# Patient Record
Sex: Male | Born: 1975
Health system: Southern US, Community
[De-identification: ages and names within clinical notes are randomized; demographics above are authoritative.]

## PROBLEM LIST (undated history)

## (undated) DIAGNOSIS — K8020 Calculus of gallbladder without cholecystitis without obstruction: Secondary | ICD-10-CM

## (undated) DIAGNOSIS — Q459 Congenital malformation of digestive system, unspecified: Secondary | ICD-10-CM

## (undated) DIAGNOSIS — M419 Scoliosis, unspecified: Secondary | ICD-10-CM

## (undated) HISTORY — DX: Calculus of gallbladder without cholecystitis without obstruction: K80.20

## (undated) HISTORY — PX: COLOSTOMY: SHX63

## (undated) HISTORY — PX: COLON SURGERY: SHX602

## (undated) HISTORY — DX: Congenital malformation of digestive system, unspecified: Q45.9

## (undated) HISTORY — DX: Scoliosis, unspecified: M41.9

---
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
## 1998-02-04 ENCOUNTER — Emergency Department (HOSPITAL_COMMUNITY): Admission: EM | Admit: 1998-02-04 | Discharge: 1998-02-04 | Payer: Self-pay | Admitting: Emergency Medicine

## 1999-05-21 ENCOUNTER — Emergency Department (HOSPITAL_COMMUNITY): Admission: EM | Admit: 1999-05-21 | Discharge: 1999-05-21 | Payer: Self-pay | Admitting: Emergency Medicine

## 1999-05-21 ENCOUNTER — Encounter: Payer: Self-pay | Admitting: Emergency Medicine

## 2004-07-27 ENCOUNTER — Ambulatory Visit: Payer: Self-pay | Admitting: Family Medicine

## 2004-10-20 ENCOUNTER — Emergency Department (HOSPITAL_COMMUNITY): Admission: EM | Admit: 2004-10-20 | Discharge: 2004-10-20 | Payer: Self-pay | Admitting: Emergency Medicine

## 2005-01-19 ENCOUNTER — Ambulatory Visit: Payer: Self-pay | Admitting: Internal Medicine

## 2005-07-08 ENCOUNTER — Emergency Department (HOSPITAL_COMMUNITY): Admission: EM | Admit: 2005-07-08 | Discharge: 2005-07-08 | Payer: Self-pay | Admitting: Emergency Medicine

## 2006-11-01 ENCOUNTER — Emergency Department (HOSPITAL_COMMUNITY): Admission: EM | Admit: 2006-11-01 | Discharge: 2006-11-01 | Payer: Self-pay | Admitting: Emergency Medicine

## 2007-12-09 ENCOUNTER — Emergency Department (HOSPITAL_COMMUNITY): Admission: EM | Admit: 2007-12-09 | Discharge: 2007-12-10 | Payer: Self-pay | Admitting: Emergency Medicine

## 2008-02-26 ENCOUNTER — Emergency Department (HOSPITAL_COMMUNITY): Admission: EM | Admit: 2008-02-26 | Discharge: 2008-02-26 | Payer: Self-pay | Admitting: Emergency Medicine

## 2008-03-06 ENCOUNTER — Emergency Department (HOSPITAL_COMMUNITY): Admission: EM | Admit: 2008-03-06 | Discharge: 2008-03-06 | Payer: Self-pay | Admitting: Emergency Medicine

## 2008-03-19 ENCOUNTER — Emergency Department (HOSPITAL_COMMUNITY): Admission: EM | Admit: 2008-03-19 | Discharge: 2008-03-19 | Payer: Self-pay | Admitting: Emergency Medicine

## 2008-03-31 ENCOUNTER — Emergency Department (HOSPITAL_COMMUNITY): Admission: EM | Admit: 2008-03-31 | Discharge: 2008-03-31 | Payer: Self-pay | Admitting: Emergency Medicine

## 2008-04-23 ENCOUNTER — Emergency Department (HOSPITAL_COMMUNITY): Admission: EM | Admit: 2008-04-23 | Discharge: 2008-04-23 | Payer: Self-pay | Admitting: Emergency Medicine

## 2009-12-20 ENCOUNTER — Emergency Department (HOSPITAL_COMMUNITY): Admission: EM | Admit: 2009-12-20 | Discharge: 2009-12-20 | Payer: Self-pay | Admitting: Emergency Medicine

## 2010-06-09 DIAGNOSIS — M419 Scoliosis, unspecified: Secondary | ICD-10-CM

## 2010-06-09 HISTORY — DX: Scoliosis, unspecified: M41.9

## 2010-07-16 ENCOUNTER — Emergency Department (HOSPITAL_COMMUNITY): Admission: EM | Admit: 2010-07-16 | Discharge: 2010-07-03 | Payer: Self-pay | Admitting: Emergency Medicine

## 2010-10-20 LAB — DIFFERENTIAL
Basophils Absolute: 0 10*3/uL (ref 0.0–0.1)
Basophils Relative: 0 % (ref 0–1)
Monocytes Relative: 5 % (ref 3–12)
Neutro Abs: 11.9 10*3/uL — ABNORMAL HIGH (ref 1.7–7.7)
Neutrophils Relative %: 88 % — ABNORMAL HIGH (ref 43–77)

## 2010-10-20 LAB — CBC
HCT: 45.8 % (ref 39.0–52.0)
MCH: 26.4 pg (ref 26.0–34.0)
MCV: 81.9 fL (ref 78.0–100.0)
RDW: 13.7 % (ref 11.5–15.5)
WBC: 13.5 10*3/uL — ABNORMAL HIGH (ref 4.0–10.5)

## 2010-10-20 LAB — COMPREHENSIVE METABOLIC PANEL
ALT: 35 U/L (ref 0–53)
AST: 33 U/L (ref 0–37)
Albumin: 4.6 g/dL (ref 3.5–5.2)
Alkaline Phosphatase: 53 U/L (ref 39–117)
BUN: 5 mg/dL — ABNORMAL LOW (ref 6–23)
CO2: 32 mEq/L (ref 19–32)
Calcium: 10 mg/dL (ref 8.4–10.5)
Chloride: 100 mEq/L (ref 96–112)
Creatinine, Ser: 1.22 mg/dL (ref 0.4–1.5)
GFR calc Af Amer: >60 mL/min (ref >60)
GFR calc non Af Amer: >60 mL/min (ref >60)
Glucose, Bld: 95 mg/dL (ref 70–99)
Potassium: 4.2 mEq/L (ref 3.5–5.1)
Sodium: 141 mEq/L (ref 135–145)
Total Bilirubin: 1.4 mg/dL — ABNORMAL HIGH (ref 0.3–1.2)
Total Protein: 7.7 g/dL (ref 6.0–8.3)

## 2010-10-20 LAB — LIPASE, BLOOD: Lipase: 26 U/L (ref 11–59)

## 2010-11-03 ENCOUNTER — Emergency Department (HOSPITAL_COMMUNITY)
Admission: EM | Admit: 2010-11-03 | Discharge: 2010-11-03 | Disposition: A | Discharge status: Home / Self Care | Payer: Self-pay | Attending: Emergency Medicine | Admitting: Emergency Medicine

## 2010-11-03 DIAGNOSIS — R0609 Other forms of dyspnea: Secondary | ICD-10-CM | POA: Insufficient documentation

## 2010-11-03 DIAGNOSIS — R112 Nausea with vomiting, unspecified: Secondary | ICD-10-CM | POA: Insufficient documentation

## 2010-11-03 DIAGNOSIS — T781XXA Other adverse food reactions, not elsewhere classified, initial encounter: Secondary | ICD-10-CM | POA: Insufficient documentation

## 2010-11-03 DIAGNOSIS — X58XXXA Exposure to other specified factors, initial encounter: Secondary | ICD-10-CM | POA: Insufficient documentation

## 2010-11-03 DIAGNOSIS — R0989 Other specified symptoms and signs involving the circulatory and respiratory systems: Secondary | ICD-10-CM | POA: Insufficient documentation

## 2011-05-07 LAB — COMPREHENSIVE METABOLIC PANEL
ALT: 46
AST: 39 — ABNORMAL HIGH
CO2: 28
Chloride: 107
GFR calc Af Amer: >60
GFR calc non Af Amer: >60
Sodium: 141
Total Bilirubin: 1.2

## 2011-05-07 LAB — DIFFERENTIAL
Basophils Absolute: 0
Eosinophils Absolute: 0.3
Eosinophils Relative: 3
Lymphs Abs: 1.2

## 2011-05-07 LAB — LIPASE, BLOOD: Lipase: 19

## 2011-05-07 LAB — CBC
RBC: 5.1
WBC: 10

## 2011-12-16 ENCOUNTER — Ambulatory Visit: Payer: Self-pay | Admitting: Family Medicine

## 2012-01-05 ENCOUNTER — Emergency Department (HOSPITAL_COMMUNITY)
Admission: EM | Admit: 2012-01-05 | Discharge: 2012-01-05 | Disposition: A | Discharge status: Home / Self Care | Payer: Self-pay | Attending: Emergency Medicine | Admitting: Emergency Medicine

## 2012-01-05 ENCOUNTER — Emergency Department (HOSPITAL_COMMUNITY): Payer: Self-pay

## 2012-01-05 ENCOUNTER — Encounter (HOSPITAL_COMMUNITY): Payer: Self-pay | Admitting: *Deleted

## 2012-01-05 DIAGNOSIS — R079 Chest pain, unspecified: Secondary | ICD-10-CM | POA: Insufficient documentation

## 2012-01-05 DIAGNOSIS — R0789 Other chest pain: Secondary | ICD-10-CM

## 2012-01-05 DIAGNOSIS — R071 Chest pain on breathing: Secondary | ICD-10-CM | POA: Insufficient documentation

## 2012-01-05 DIAGNOSIS — W1809XA Striking against other object with subsequent fall, initial encounter: Secondary | ICD-10-CM | POA: Insufficient documentation

## 2012-01-05 DIAGNOSIS — S2341XA Sprain of ribs, initial encounter: Secondary | ICD-10-CM | POA: Insufficient documentation

## 2012-01-05 NOTE — ED Notes (Signed)
Discharge instructions reviewed with pt; verbalizes understanding.  No questions asked; no further c/o's voiced.  Pt ambulatory to lobby.  NAD noted. 

## 2012-01-05 NOTE — ED Notes (Signed)
To ED for eval of right side pinpoint cp. Started a week ago. States any movement and lying on right side makes pain worse. Skin w/d, resp e/u. Denies cough. Denies fevers. Walking and general activity does not make pain worse, per pt

## 2012-01-05 NOTE — ED Provider Notes (Signed)
History   This chart was scribed for Rickey Melter, MD by Rickey Fisher. The patient was seen in room STRE7/STRE7. Patient's care was started at 1146.   CSN: [id number]  Arrival date & time 01/05/12  1146   None     Chief Complaint  Patient presents with  . Chest Pain    (Consider location/radiation/quality/duration/timing/severity/associated sxs/prior treatment) HPI Rickey Fisher is a 36 y.o. male who presents to the Emergency Department complaining of right sided chest pain onset one week ago. Laying on right side, Sneezing and moving right shoulder exacerbates his chest. Denies fever, cough. Pt notes a fall one week ago hitting right side of body when he missed a step.     History reviewed. No pertinent past medical history.  Past Surgical History  Procedure Date  . Colostomy     No family history on file.  History  Substance Use Topics  . Smoking status: Current Everyday Smoker -- 0.5 packs/day    Types: Cigarettes  . Smokeless tobacco: Not on file  . Alcohol Use: Yes     occassional      Review of Systems  All other systems reviewed and are negative.    Allergies  Shellfish allergy  Home Medications  No current outpatient prescriptions on file.  BP 133/85  Pulse 70  Temp(Src) 98.2 F (36.8 C) (Oral)  Resp 16  SpO2 99%  Physical Exam  Nursing note and vitals reviewed. Constitutional: He is oriented to person, place, and time. He appears well-developed and well-nourished. No distress.  HENT:  Head: Normocephalic and atraumatic.  Eyes: EOM are normal.  Neck: Neck supple. No tracheal deviation present.  Cardiovascular: Normal rate.   Pulmonary/Chest: Effort normal. No respiratory distress.  Abdominal: He exhibits no distension.  Musculoskeletal: Normal range of motion. He exhibits tenderness. He exhibits no edema.       Tenderness and swelling to the junction of the right second rib and sternum  Neurological: He is alert and oriented to  person, place, and time. No sensory deficit.  Skin: Skin is warm and dry.  Psychiatric: He has a normal mood and affect. His behavior is normal.    ED Course  Procedures (including critical care time)  1344 Pt seen and agrees with course of care.    Date: 01/05/2012  Rate: 83  Rhythm: normal sinus rhythm  QRS Axis: right  Intervals: normal  ST/T Wave abnormalities: normal  Conduction Disutrbances:none  Narrative Interpretation: sinus arrhythymia  Old EKG Reviewed: unchanged    Labs Reviewed - No data to display Dg Chest 2 View  01/05/2012  *RADIOLOGY REPORT*  Clinical Data: Chest pain.  CHEST - 2 VIEW  Comparison: Chest x-ray 12/20/2009.  Findings: Lung volumes are normal.  No consolidative airspace disease.  No pleural effusions.  No pneumothorax.  No pulmonary nodule or mass noted.  Pulmonary vasculature and the cardiomediastinal silhouette are within normal limits.  IMPRESSION: 1. No radiographic evidence of acute cardiopulmonary disease.  Original Report Authenticated By: Florencia Reasons, M.D.     1. Chest wall pain   2. Sprain of costal cartilage without injury to sternum       MDM  Isolated musculoskeletal injury, right upper chest wall. Doubt dislocated rib, pneumothorax, ACS, pneumonia. Stable for discharge   Plan: Home Medications- IBU; Home Treatments- heat; Recommended follow up- PCP prn     I personally performed the services described in this documentation, which was scribed in my presence. The recorded information has  been reviewed and considered.     Rickey Melter, MD 01/05/12 1352

## 2012-01-05 NOTE — Discharge Instructions (Signed)
You have strained the junction between the right second rib, and your sternum. It will improve with time. You can use heat on the area to help the discomfort. For pain, take ibuprofen, 400 mg 3 times a day. See the Dr. of your choice for problems.   Chest Wall Pain Chest wall pain is pain in or around the bones and muscles of your chest. It may take up to 6 weeks to get better. It may take longer if you must stay physically active in your work and activities.  CAUSES  Chest wall pain may happen on its own. However, it may be caused by:  A viral illness like the flu.   Injury.   Coughing.   Exercise.   Arthritis.   Fibromyalgia.   Shingles.  HOME CARE INSTRUCTIONS   Avoid overtiring physical activity. Try not to strain or perform activities that cause pain. This includes any activities using your chest or your abdominal and side muscles, especially if heavy weights are used.   Put ice on the sore area.   Put ice in a plastic bag.   Place a towel between your skin and the bag.   Leave the ice on for 15 to 20 minutes per hour while awake for the first 2 days.   Only take over-the-counter or prescription medicines for pain, discomfort, or fever as directed by your caregiver.  SEEK IMMEDIATE MEDICAL CARE IF:   Your pain increases, or you are very uncomfortable.   You have a fever.   Your chest pain becomes worse.   You have new, unexplained symptoms.   You have nausea or vomiting.   You feel sweaty or lightheaded.   You have a cough with phlegm (sputum), or you cough up blood.  MAKE SURE YOU:   Understand these instructions.   Will watch your condition.   Will get help right away if you are not doing well or get worse.  Document Released: 07/26/2005 Document Revised: 07/15/2011 Document Reviewed: 03/22/2011 [facility] Patient Information 2012 [place], [place].Costochondritis Costochondritis (Tietze syndrome), or costochondral separation, is a swelling and  irritation (inflammation) of the tissue (cartilage) that connects your ribs with your breastbone (sternum). It may occur on its own (spontaneously), through damage caused by an accident (trauma), or simply from coughing or minor exercise. It may take up to 6 weeks to get better and longer if you are unable to be conservative in your activities. HOME CARE INSTRUCTIONS   Avoid exhausting physical activity. Try not to strain your ribs during normal activity. This would include any activities using chest, belly (abdominal), and side muscles, especially if heavy weights are used.   Use ice for 15 to 20 minutes per hour while awake for the first 2 days. Place the ice in a plastic bag, and place a towel between the bag of ice and your skin.   Only take over-the-counter or prescription medicines for pain, discomfort, or fever as directed by your caregiver.  SEEK IMMEDIATE MEDICAL CARE IF:   Your pain increases or you are very uncomfortable.   You have a fever.   You develop difficulty with your breathing.   You cough up blood.   You develop worse chest pains, shortness of breath, sweating, or vomiting.   You develop new, unexplained problems (symptoms).  MAKE SURE YOU:   Understand these instructions.   Will watch your condition.   Will get help right away if you are not doing well or get worse.  Document Released: 05/05/2005  Document Revised: 07/15/2011 Document Reviewed: 03/13/2008 [facility] Patient Information 2012 [place], [place]. RESOURCE GUIDE  Dental Problems  Patients with Medicaid: [facility] [phone] [address]                                           [phone] [address] Phone:  [phone]                                                  Phone:  [phone]  If unable to pay or uninsured, contact:  [facility] or [facility]. to become qualified for the adult dental clinic.  Chronic Pain Problems Contact  [facility]  [phone] Patients need to be referred by their primary care doctor.  Insufficient Money for Medicine Contact [employer]:  call "211" or [facility] [phone].  No Primary Care Doctor Call [facility]  [phone] Other agencies that provide inexpensive medical care    [facility]  [phone]    [facility] Internal [facility]  [phone]    [facility]  [phone]    [facility]  [phone]    [facility]  [phone]    [facility]  [phone]  Psychological Services [facility]  [phone] [facility]  [phone] [facility] Mental [facility]   [phone] (emergency services [phone])  Substance Abuse Resources [facility]  [phone] [facility] 2523013374 [facility] [place] [phone] Floydene Flock [phone] Residential & Outpatient Substance Abuse Program  [phone]  Abuse/Neglect [facility] Child Abuse Hotline (223)155-2621 [facility] Child Abuse Hotline 647-144-8651 (After Hours)  Emergency Shelter [facility] [phone]  M[facility] [phone] [place] Alert Services [phone]  MRSA Hotline #:   (517)020-1676    [facility] Resources  [facility]     [employer]                          [facility] Dept. [address] [place]                       [address]      [address]                                                  Cristobal Goldmann Phone:  [phone]                                   Phone:  [phone]  Phone:  [phone]  [facility] Mental Health Phone:  [phone]  [facility] Child Abuse Hotline 863-585-9024 [phone] (After Hours)

## 2012-02-03 ENCOUNTER — Ambulatory Visit: Payer: Self-pay | Admitting: Family Medicine

## 2012-02-13 ENCOUNTER — Emergency Department (HOSPITAL_COMMUNITY): Payer: Self-pay

## 2012-02-13 ENCOUNTER — Emergency Department (HOSPITAL_COMMUNITY)
Admission: EM | Admit: 2012-02-13 | Discharge: 2012-02-14 | Disposition: A | Discharge status: Home / Self Care | Payer: Self-pay | Attending: Emergency Medicine | Admitting: Emergency Medicine

## 2012-02-13 ENCOUNTER — Encounter (HOSPITAL_COMMUNITY): Payer: Self-pay | Admitting: Nurse Practitioner

## 2012-02-13 DIAGNOSIS — M542 Cervicalgia: Secondary | ICD-10-CM | POA: Insufficient documentation

## 2012-02-13 DIAGNOSIS — S01501A Unspecified open wound of lip, initial encounter: Secondary | ICD-10-CM | POA: Insufficient documentation

## 2012-02-13 DIAGNOSIS — W010XXA Fall on same level from slipping, tripping and stumbling without subsequent striking against object, initial encounter: Secondary | ICD-10-CM | POA: Insufficient documentation

## 2012-02-13 DIAGNOSIS — Z79899 Other long term (current) drug therapy: Secondary | ICD-10-CM | POA: Insufficient documentation

## 2012-02-13 DIAGNOSIS — W19XXXA Unspecified fall, initial encounter: Secondary | ICD-10-CM

## 2012-02-13 DIAGNOSIS — S6290XA Unspecified fracture of unspecified wrist and hand, initial encounter for closed fracture: Secondary | ICD-10-CM

## 2012-02-13 DIAGNOSIS — S01511A Laceration without foreign body of lip, initial encounter: Secondary | ICD-10-CM

## 2012-02-13 DIAGNOSIS — K089 Disorder of teeth and supporting structures, unspecified: Secondary | ICD-10-CM | POA: Insufficient documentation

## 2012-02-13 DIAGNOSIS — S161XXA Strain of muscle, fascia and tendon at neck level, initial encounter: Secondary | ICD-10-CM

## 2012-02-13 DIAGNOSIS — M79609 Pain in unspecified limb: Secondary | ICD-10-CM | POA: Insufficient documentation

## 2012-02-13 MED ORDER — TETANUS-DIPHTH-ACELL PERTUSSIS 5-2.5-18.5 LF-MCG/0.5 IM SUSP: 0.5000 mL | Freq: Once | INTRAMUSCULAR | Status: DC

## 2012-02-13 MED ORDER — OXYCODONE-ACETAMINOPHEN 5-325 MG PO TABS
2.0000 | ORAL_TABLET | Freq: Once | ORAL | Status: AC
  Administered 2012-02-13: 2 via ORAL
  Filled 2012-02-13: qty 2

## 2012-02-13 MED ORDER — TETANUS-DIPHTHERIA TOXOIDS TD 5-2 LFU IM INJ
0.5000 mL | INJECTION | Freq: Once | INTRAMUSCULAR | Status: AC
  Administered 2012-02-13: 0.5 mL via INTRAMUSCULAR
  Filled 2012-02-13: qty 0.5

## 2012-02-13 NOTE — ED Notes (Addendum)
Pt states he tripped walking down the road landing on concrete. C/o neck pain and laceration to vermillion border of upper and lower L lip, states teeth are intact. A&Ox4, resp e/u. Unknown last tetanus shot. Also c/o r hand pain from trying to catch himself.

## 2012-02-13 NOTE — Progress Notes (Signed)
Orthopedic Tech Progress Note Patient Details:  Rickey Fisher Apr 29, 1976 [id number]  Ortho Devices Type of Ortho Device: Ulna gutter splint Ortho Device/Splint Location: right hand Ortho Device/Splint Interventions: Application   Rickey Fisher 02/13/2012, 8:55 PM

## 2012-02-13 NOTE — ED Provider Notes (Signed)
History     CSN: [id number]  Arrival date & time 02/13/12  6962   First MD Initiated Contact with Patient 02/13/12 2047      Chief Complaint  Patient presents with  . Lip Laceration    (Consider location/radiation/quality/duration/timing/severity/associated sxs/prior treatment) HPI Comments: Patient tripped over a loose shoe lace falling to the ground caught himself with R hand which is now painful and hit his face on the ground Now with laceration to both upper and lower lip  Teeth intact No LOC   The history is provided by the patient.    History reviewed. No pertinent past medical history.  Past Surgical History  Procedure Date  . Colostomy   . Colon surgery     History reviewed. No pertinent family history.  History  Substance Use Topics  . Smoking status: Current Everyday Smoker -- 0.1 packs/day    Types: Cigarettes  . Smokeless tobacco: Not on file  . Alcohol Use: No      Review of Systems  Constitutional: Negative for fever and chills.  HENT: Positive for neck pain and dental problem. Negative for hearing loss, ear pain, neck stiffness and ear discharge.   Gastrointestinal: Negative for nausea.  Skin: Positive for wound.  Neurological: Negative for dizziness, weakness and headaches.    Allergies  Shellfish allergy  Home Medications   Current Outpatient Rx  Name Route Sig Dispense Refill  . METAXALONE 800 MG PO TABS Oral Take 0.5 tablets (400 mg total) by mouth 3 (three) times daily. 21 tablet 0  . OXYCODONE-ACETAMINOPHEN 5-325 MG PO TABS Oral Take 2 tablets by mouth once. 20 tablet 0    BP 133/84  Pulse 101  Temp 98.5 F (36.9 C) (Oral)  Resp 19  SpO2 100%  Physical Exam  Constitutional: He is oriented to person, place, and time. He appears well-developed and well-nourished.  HENT:  Head: Normocephalic. Head is with laceration.         Teeth intact very poor dental hygeine   Neck: Normal range of motion. Muscular tenderness present.     Cardiovascular: Normal rate.   Pulmonary/Chest: Effort normal.  Abdominal: Soft.  Musculoskeletal: He exhibits tenderness. He exhibits no edema.       Arms: Neurological: He is alert and oriented to person, place, and time.  Skin: Skin is warm and dry. Abrasion, bruising and laceration noted.    ED Course  LACERATION REPAIR Date/Time: 02/13/2012 9:31 PM Performed by: Arman Filter Authorized by: Arman Filter Consent: Verbal consent obtained. Risks and benefits: risks, benefits and alternatives were discussed Patient understanding: patient states understanding of the procedure being performed Patient identity confirmed: verbally with patient Time out: Immediately prior to procedure a "time out" was called to verify the correct patient, procedure, equipment, support staff and site/side marked as required. Body area: head/neck Location details: upper lip Full thickness lip laceration: yes Vermillion border involved: yes Laceration length: 0.5 cm Foreign bodies: unknown Tendon involvement: none Nerve involvement: none Vascular damage: no Anesthesia: local infiltration Local anesthetic: lidocaine 1% without epinephrine Anesthetic total: 0.5 ml Patient sedated: no Preparation: Patient was prepped and draped in the usual sterile fashion. Irrigation solution: saline Amount of cleaning: standard Debridement: none Degree of undermining: none Subcutaneous closure: 5-0 Vicryl Number of sutures: 3 Technique: simple Approximation: close Approximation difficulty: simple Lip approximation: vermillion border well aligned Patient tolerance: Patient tolerated the procedure well with no immediate complications.   (including critical care time)  Labs Reviewed - No data  to display Dg Cervical Spine Complete  02/13/2012  *RADIOLOGY REPORT*  Clinical Data: Fall 3 hours ago  CERVICAL SPINE - COMPLETE 4+ VIEW  Comparison: None.  Findings: Reversal of the normally lordotic cervical spine.  Unremarkable prevertebral soft tissues.  Retrolisthesis C6 upon C7 is approximately 4 mm.  No vertebral compression deformity.  There is moderate narrowing at C3-4, C4-5, and C6-7.  Anterior osteophyte formation occurs from C4-C7.  No definite fractures are visualized. Foramen are grossly patent. Odontoid view is limited.  IMPRESSION: There is malalignment in the cervical spine as described with reversed lordosis and retrolisthesis C6 upon C7.  Occult injury cannot be excluded.  CT can be performed to further delineate.  Original Report Authenticated By: Donavan Burnet, M.D.   Ct Cervical Spine Wo Contrast  02/13/2012  *RADIOLOGY REPORT*  Clinical Data: Status post fall; neck pain.  Question of occult injury on cervical spine radiographs.  CT CERVICAL SPINE WITHOUT CONTRAST  Technique:  Multidetector CT imaging of the cervical spine was performed. Multiplanar CT image reconstructions were also generated.  Comparison: Cervical spine radiographs performed earlier today at 10:03 p.m.  Findings: There is no evidence of fracture or subluxation. Reversal of the normal lordotic curvature of the cervical spine appears chronic in nature, with associated anterior disc osteophyte complexes.  There is slight narrowing of the intervertebral disc space at C6-C7.  Slight flattening of the vertebral bodies is noted at C5 and C6.  Prevertebral soft tissues are within normal limits.  Prominent scattered calcification is seen within the left thyroid lobe, with an associated poorly characterized 1.4 cm mass; additional hypodensity is noted within the right thyroid lobe.  A prominent bleb is noted at the right lung apex.  No significant soft tissue abnormalities are seen.  The visualized portions of the brain are grossly unremarkable in appearance.  IMPRESSION:  1.  No evidence of fracture or subluxation along the cervical spine. 2.  Mild chronic degenerative changes noted at the lower cervical spine. 3.  Prominent calcification at  the left thyroid lobe, with poorly characterized hypodense lesions in both thyroid lobes, measuring approximately 1.4 cm on each side.  Consider further evaluation with thyroid ultrasound.  If patient is clinically hyperthyroid, consider nuclear medicine thyroid uptake and scan. 4.  Prominent bleb at the right lung apex.  Original Report Authenticated By: Tonia Ghent, M.D.   Dg Hand Complete Right  02/13/2012  *RADIOLOGY REPORT*  Clinical Data: Fall.  Third and fourth finger pain.  RIGHT HAND - COMPLETE 3+ VIEW  Comparison: None.  Findings: Anatomic alignment bones of the right hand.  There is a nondisplaced transverse fracture of the base of the proximal phalanx of the right ring finger. No other fractures are identified.  Long finger appears intact.  No intra-articular extension of the fracture.  IMPRESSION: Nondisplaced transverse fracture of the proximal phalanx base of the right ring finger.  Original Report Authenticated By: Andreas Newport, M.D.     1. Fall   2. Lip laceration   3. Cervical strain   4. Hand fracture       MDM  Patient still having significant neck pain placed in soft collar for comfort        Arman Filter, NP 02/14/12 0018  Arman Filter, NP 02/14/12 1610

## 2012-02-14 MED ORDER — OXYCODONE-ACETAMINOPHEN 5-325 MG PO TABS: 2.0000 | ORAL_TABLET | Freq: Once | ORAL | Status: AC

## 2012-02-14 MED ORDER — METAXALONE 400 MG HALF TABLET
400.0000 mg | ORAL_TABLET | Freq: Once | ORAL | Status: AC
  Administered 2012-02-14: 400 mg via ORAL
  Filled 2012-02-14: qty 1

## 2012-02-14 MED ORDER — METAXALONE 800 MG PO TABS: 400.0000 mg | ORAL_TABLET | Freq: Three times a day (TID) | ORAL | Status: AC

## 2012-02-14 NOTE — ED Provider Notes (Signed)
Medical screening examination/treatment/procedure(s) were performed by non-physician practitioner and as supervising physician I was immediately available for consultation/collaboration.   Keimari B. Bernette Mayers, MD 02/14/12 [phone]

## 2012-04-05 ENCOUNTER — Telehealth: Payer: Self-pay | Admitting: *Deleted

## 2012-04-05 NOTE — Telephone Encounter (Signed)
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 He states he makes his colostomy bags out of [employer] grocery bags and knows how to keep this area clean, he states he is on no medicines but feels very worried not having a regular physician, states he goes to ED occasionally to get "checked out". Had a fall in July stating he "passed out". Spoke w/ doriss. And she in turn spoke w/ dr Josem Kaufmann, pt is given appt 8/29 at 1315 w/ sadek per doriss.

## 2012-04-06 ENCOUNTER — Encounter: Payer: Self-pay | Admitting: Internal Medicine

## 2012-04-07 ENCOUNTER — Encounter: Payer: Self-pay | Admitting: Licensed Clinical Social Worker

## 2012-04-07 ENCOUNTER — Encounter: Payer: Self-pay | Admitting: Internal Medicine

## 2012-04-07 ENCOUNTER — Ambulatory Visit (INDEPENDENT_AMBULATORY_CARE_PROVIDER_SITE_OTHER): Payer: Self-pay | Admitting: Internal Medicine

## 2012-04-07 VITALS — BP 108/64 | HR 60 | Temp 96.5°F | Ht 74.0 in | Wt 150.1 lb

## 2012-04-07 DIAGNOSIS — Z933 Colostomy status: Secondary | ICD-10-CM

## 2012-04-07 DIAGNOSIS — Z932 Ileostomy status: Secondary | ICD-10-CM | POA: Insufficient documentation

## 2012-04-07 NOTE — Progress Notes (Signed)
Subjective:   Patient ID: Rickey Fisher male   DOB: [birth year] 36 y.o.   MRN: [id number]  HPI: [name] is a 36 y.o. man with history significant for cholelithiasis and colostomy who presents today to establish care. 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 He has no complaints today, he would just like help in acquiring colostomy supplies.  Bag since birth; "something wrong with intestines" No MD since childhood  No bloody/purulent discharge.   No fevers  Past Medical History  Diagnosis Date  . Cholelithiasis     CT abd 06/2010   Family History  Problem Relation Age of Onset  . Lupus Father   . Hypertension Mother    History   Social History  . Marital Status: Single    Spouse Name: N/A    Number of Children: 0  . Years of Education: GED   Occupational History  .     Social History Main Topics  . Smoking status: Current Everyday Smoker -- 0.1 packs/day    Types: Cigarettes  . Smokeless tobacco: None  . Alcohol Use: No  . Drug Use: Yes    Special: Marijuana  . Sexually Active: No   Other Topics Concern  . None   Social History Narrative   Worked at [employer]; Grew up in [place]; Went to [employer], dropped out in 11th grade,but acquired GEDLives with friends/family off on on "up in the [place] 3 brothers, 1 sister - [place] in [place].   Review of Systems: Constitutional: Denies fever, chills, diaphoresis, and fatigue. Decreased appetite; mediocre energy HEENT: Denies photophobia, eye pain, redness, hearing loss, ear pain, congestion, sore throat, rhinorrhea, sneezing, mouth sores, trouble swallowing, neck pain, neck stiffness and tinnitus.   Respiratory: Denies SOB, DOE, cough, chest tightness,  and wheezing.   Cardiovascular: Denies chest pain, palpitations and leg swelling.  Gastrointestinal: Denies nausea, vomiting, abdominal pain  (occassional cramps, pruritis- not currently), blood in stool and abdominal distention.  Genitourinary: Denies dysuria, urgency, frequency, hematuria, flank pain and difficulty urinating.  Musculoskeletal: Denies myalgias, back pain, joint swelling, arthralgias and gait problem.  Skin: Denies pallor, rash and wound.  Neurological: Denies dizziness, seizures, syncope, weakness, light-headedness, numbness and headaches.  Psychiatric/Behavioral: Denies suicidal ideation, mood changes, confusion, nervousness, sleep disturbance and agitation  Objective:  Physical Exam: Filed Vitals:   04/07/12 1005  BP: 108/64  Pulse: 60  Temp: 96.5 F (35.8 C)  TempSrc: Oral  Height: 6\' 2"  (1.88 m)  Weight: 150 lb 1.6 oz (68.085 kg)   Constitutional: Vital signs reviewed.  Patient is a well-developed and well-nourished man in no acute distress and cooperative with exam.  Mouth: no erythema or exudates, MMM, poor dentition Eyes: PERRL, EOMI, conjunctivae normal, No scleral icterus.  Cardiovascular: RRR, S1 normal, S2 normal, no MRG, pulses symmetric and intact bilaterally Pulmonary/Chest: CTAB, no wheezes, rales, or rhonchi Abdominal: Soft. Non-tender, non-distended, bowel sounds are normal, no masses, organomegaly, or guarding present. Right lower quadrant colostomy nontender nor erythematous- no colostomy bag in place, but uses a [employer] bag; skin surrounding colostomy is pink and broken down GU: no CVA tenderness Neurological: A&O x3, Strength is normal and symmetric bilaterally, cranial nerve II-XII are grossly intact, no focal motor deficit, sensory intact to light touch bilaterally.  Skin: Warm, dry and intact. No rash, cyanosis, or clubbing.  Psychiatric: Normal mood and affect.  speech and behavior is normal. Judgment and thought content normal. Cognition and memory are normal.   Assessment & Plan:  Case and care discussed with Dr. Kem Kays. Please see problem-oriented charting for further details.  Patient to return as needed, or in approximately 6 months to 1 year for routine followup.

## 2012-04-07 NOTE — Assessment & Plan Note (Signed)
Patient referred to Jaynee Eagles (financial assistance) and Perry Mount (social work)  -Once he is approved for the orange card, he would benefit from evaluation by surgery -We are going to help patient acquire colostomy supplies

## 2012-04-07 NOTE — Progress Notes (Signed)
Mr. Dahlen is new to [facility] and has been using the ED for his ostomy care.  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  States all care and surgeries were done at [facility], but unsure of physician names.  Mr. Mounger has applied for SSDI but was denied, unsure as to why.  Mr. Bouknight receives food stamps and currently lives between family/friends homes.  Pt receives food stamps.  Currently, Mr. Branan states he has been using plastic grocery bags for his ostomy care due to being unable to afford supplies.  CSW met with Mr. Tesler after his scheduled appt.  Discussed Mr. Langille main concern, pt indicates medical issues and obtaining ostomy supplies.  CSW along with pt called the [employer] patient assistance program.  Pt to receive samples and application in approx one week.  Mr. Boomhower to return to [facility] to have CSW assist with completing application.  Mr. Beaumier received 2 colostomy bags today.   Mr. Kienle has his GED and worked in the [employer], but was let go due to working conditions around 2006.  Pt has not worked since.  Mr. Brisendine would like to continue his application for disability/medicaid.  CSW encouraged Mr. Hermann to inquire into Vocational [employer], [employer] and [employer]. Pt denies add'l needs at this time. Mr. Sayre has CSW contact information and hours available.

## 2012-04-07 NOTE — Patient Instructions (Signed)
-  Please be sure to meet with Rudell Cobb and Lynnae January.  We are going to try to get you colostomy supplies.  -If you don't feel well, be sure to call the clinic - we're here for you!  Please be sure to bring all of your medications with you to every visit.  Should you have any new or worsening symptoms, please be sure to call the clinic at [phone].

## 2012-04-21 ENCOUNTER — Ambulatory Visit (INDEPENDENT_AMBULATORY_CARE_PROVIDER_SITE_OTHER): Payer: Self-pay | Admitting: *Deleted

## 2012-04-21 ENCOUNTER — Telehealth: Payer: Self-pay | Admitting: *Deleted

## 2012-04-21 DIAGNOSIS — Z433 Encounter for attention to colostomy: Secondary | ICD-10-CM

## 2012-04-21 NOTE — Telephone Encounter (Signed)
Spoke w/ pt in clinic just here today for ostomy assist, does state he needs supplies, i tried to locate some but didn't find any. Ostomy nurse gave pt 3 bags and pt has appt next week for back pain so will update then. After visit i spoke w/ shanag. sw and she states the company was to mail pt supplies. i called pt and he states yes he got 3 but is afraid they won't send more, he states he completed paperwork and sent back, i will check w/ sw again and call to see if company has rec'd paperwork back.

## 2012-04-21 NOTE — Telephone Encounter (Signed)
Most recent encounter CSW had with pt, Pt spoke with [employer] Patient Assistance Rep regarding delivery of samples to the address pt provided to Rep.  Pt was to receive samples along with an application form.  Please see CSW encounter for add'l details.

## 2012-04-21 NOTE — Progress Notes (Signed)
Ostomy nurse consult:  Pt familiar to WOC team from previous visits to the ER for supplies.  He has had an ileostomy since infancy and has had to manage pouching on his own.  He is independent with pouch application and routines, but obtaining supplies has been difficult since he does not have insurance or Medicare.  Consult requested for skin denuded to peristomal area last week during office visit.  Pt states this occurs when he runs out of pouches and has to apply grocery bags and duct tape. Denies further problems at this time since he obtained the proper pouching system last week.   Clinic has referred pt to [facility] discharge program which has an indigent plan to assist with obtaining supplies.  Gave pt 3 flat one piece pouches for use until other supplies available.  Demonstrated use of stoma powder when skin breakdown occurs and gave him one bottle.  Discussed dietary concerns and avoiding dehydration.  Pt asks appropriate questions.  Stoma red and viable above skin level.  Pouch intact with good seal, not removed at this time. Will not plan to follow further unless re-consulted.  [address] RN, MSN, [employer]  [phone]

## 2012-04-24 ENCOUNTER — Ambulatory Visit: Payer: Self-pay | Admitting: Internal Medicine

## 2012-08-16 ENCOUNTER — Encounter: Payer: Self-pay | Admitting: *Deleted

## 2012-08-16 NOTE — Progress Notes (Signed)
Pt presents c/o being out of colostomy bags/ supplies, he was asked about supplies from [employer] and states he never rec'd the first shipment, he was ask why he had not followed up with [employer] by phone, did he complete his paperwork, he states he completed paperwork and mailed it back but he lost the contact information, he states he lost the clinic phone number and that is why he presents in person today. He is informed that to maintain proper amt of supplies and proper medical care he must keep regular appts, call for problems and "stay on top of programs" that he is enrolled in. He states he will be more responsible and will put the ph#s in his phone. i will give shanag. sw his ph# and ask her to call him i have called melodya. cwocn at [phone], she is going to call [employer] and speak w/ gwen and possibly get pt some bags today but may not be possible

## 2012-08-22 ENCOUNTER — Ambulatory Visit (INDEPENDENT_AMBULATORY_CARE_PROVIDER_SITE_OTHER): Payer: Self-pay | Admitting: Internal Medicine

## 2012-08-22 ENCOUNTER — Ambulatory Visit (HOSPITAL_COMMUNITY)
Admission: RE | Admit: 2012-08-22 | Discharge: 2012-08-22 | Disposition: A | Discharge status: Home / Self Care | Payer: Self-pay | Source: Ambulatory Visit | Attending: Internal Medicine | Admitting: Internal Medicine

## 2012-08-22 ENCOUNTER — Encounter: Payer: Self-pay | Admitting: Internal Medicine

## 2012-08-22 VITALS — BP 119/83 | HR 105 | Temp 96.8°F | Ht 74.5 in | Wt 147.8 lb

## 2012-08-22 DIAGNOSIS — R059 Cough, unspecified: Secondary | ICD-10-CM | POA: Insufficient documentation

## 2012-08-22 DIAGNOSIS — Z933 Colostomy status: Secondary | ICD-10-CM

## 2012-08-22 DIAGNOSIS — R05 Cough: Secondary | ICD-10-CM

## 2012-08-22 DIAGNOSIS — R0602 Shortness of breath: Secondary | ICD-10-CM | POA: Insufficient documentation

## 2012-08-22 DIAGNOSIS — M4120 Other idiopathic scoliosis, site unspecified: Secondary | ICD-10-CM | POA: Insufficient documentation

## 2012-08-22 NOTE — Progress Notes (Signed)
Subjective:   Patient ID: Rickey Fisher male   DOB: 10-23-1975 37 y.o.   MRN: [id number]  HPI: [name] is a 37 y.o. young man with history of cholelithiasis and colostomy who presents today for followup. He was supposed to have gotten his orange card, and we were going to subsequently refer him to surgery because he has had a colostomy since birth after having a colon surgery that he does not have details on. He has had some difficulties with keeping up with colostomy supplies, and has been plugged in to social work for this problem. He still has not gotten his orange card. He has not had a doctor since childhood. He denies purulent or bloody discharge from his colostomy site.  He does complain of a cold for the last week. He notes a runny nose, and a green/white sputum producing cough. He reports symptoms are similar to when he had pneumonia twice in the past. He denies fever but reports chills. Denies sick contacts. He reports shortness of breath, and has decreased his cigarette smoking to approximately one per day. He reports a decreased appetite. He had a bitemporal headache yesterday that has resolved. No muscle aches. No flu shot this year. No nausea or vomiting. He cannot tell if symptoms are staying the same or improving, but definitely not worsening.    Past Medical History  Diagnosis Date  . Cholelithiasis     CT abd 06/2010   No current outpatient prescriptions on file.   Family History  Problem Relation Age of Onset  . Lupus Father   . Hypertension Mother    History   Social History  . Marital Status: Single    Spouse Name: N/A    Number of Children: 0  . Years of Education: GED   Occupational History  .     Social History Main Topics  . Smoking status: Current Every Day Smoker -- 0.1 packs/day    Types: Cigarettes  . Smokeless tobacco: None  . Alcohol Use: No  . Drug Use: Yes    Special: Marijuana  . Sexually Active: No   Other Topics Concern  . None     Social History Narrative   Worked at [employer]; Grew up in [place]; Went to [employer], dropped out in 11th grade,but acquired GEDLives with friends/family off on on "up in the [place] 3 brothers, 1 sister - all in [place] Dad in [place] as well.   Review of Systems: Constitutional: Denies fever, diaphoresis HEENT: Denies photophobia, eye pain, redness, hearing loss, ear pain, sore throat, sneezing, mouth sores, trouble swallowing, neck pain, neck stiffness and tinnitus.   Respiratory: Denies SOB, DOE, chest tightness,  and wheezing.   Cardiovascular: Denies chest pain, palpitations and leg swelling.  Gastrointestinal: Denies nausea, vomiting, abdominal pain, diarrhea, constipation, blood in stool and abdominal distention.  Genitourinary: Denies dysuria, urgency, frequency, hematuria, flank pain and difficulty urinating.  Musculoskeletal: Denies myalgias, back pain, joint swelling, arthralgias and gait problem.  Skin: Denies pallor, rash and wound.  Neurological: Denies dizziness, seizures, syncope, weakness, light-headedness, numbness and headaches.  Psychiatric/Behavioral: Denies suicidal ideation, mood changes, confusion, nervousness, sleep disturbance and agitation  Objective:  Physical Exam: Filed Vitals:   08/22/12 1423  BP: 119/83  Pulse: 105  Temp: 96.8 F (36 C)  TempSrc: Oral  Height: 6' 2.5" (1.892 m)  Weight: 147 lb 12.8 oz (67.042 kg)  SpO2: 98%   Constitutional: Vital signs reviewed.  Patient is a well-developed and well-nourished man in  no acute distress and cooperative with exam.  Head: Normocephalic and atraumatic, no sinus tenderness Mouth: no erythema or exudates, MMM Eyes: PERRL, EOMI, conjunctivae normal, No scleral icterus.  Neck: Supple, Trachea midline normal ROM, No JVD, mass, thyromegaly, or carotid bruit present.  Cardiovascular: Tachycardic, S1 normal, S2 normal, no MRG, pulses symmetric and intact bilaterally Pulmonary/Chest: CTAB, no  wheezes, rales, or rhonchi Abdominal: Soft. Non-tender, non-distended, bowel sounds are normal; Right lower quadrant colostomy nontender nor erythematous- colostomy bag in place; skin surrounding colostomy is pink, but appears better than in August  Musculoskeletal: No joint deformities, erythema, or stiffness, ROM full and no nontender Hematology: no cervical adenopathy.  Neurological: A&O x3, Strength is normal and symmetric bilaterally, cranial nerve II-XII are grossly intact, no focal motor deficit, sensory intact to light touch bilaterally.  Skin: Warm, dry and intact. No rash, cyanosis, or clubbing.  Psychiatric: Normal mood and affect. speech and behavior is normal. Judgment and thought content normal. Cognition and memory are normal.   Assessment & Plan:  Case and care discussed with Dr. Hortencia Pilar. Please see problem-oriented charting for further details. Patient to return if symptoms do not improve or worsen, otherwise he should return once he has the orange card for surgery referral.

## 2012-08-22 NOTE — Patient Instructions (Addendum)
General Instructions: Please go have a chest x-ray done today.  If your results are abnormal, I will call your to prescribe antibiotics.  Please be sure to set up your orange card so we may refer you to surgery.  Please be sure to bring all of your medications with you to every visit.  Should you have any new or worsening symptoms, please be sure to call the clinic at [phone].    Progress Toward Treatment Goals:  Treatment Goal 08/22/2012  Stop smoking smoking less    Self Care Goals & Plans:  Self Care Goal 08/22/2012  Manage my medications follow the sick day instructions if I am sick  Monitor my health keep track of my weight  Eat healthy foods eat more vegetables; eat fruit for snacks and desserts; eat smaller portions; eat baked foods instead of fried foods  Be physically active take a walk every day; find an activity I enjoy  Stop smoking go to the [employer] ([employer]); call [employer] (1-800-QUIT-NOW)       Care Management & Community Referrals:  Referral 08/22/2012  Referrals made for care management support none needed

## 2012-08-22 NOTE — Assessment & Plan Note (Addendum)
Symptoms have been ongoing for about a week, but seems to be improving, which is suggestive of viral etiology. He has history of pneumonia in 2011, for which he was treated in the emergency room with doxycycline. He also had a pneumonia in 2008 for which she was also treated with doxycycline.  -Get chest x-ray, if pneumonia than treat with doxycycline, as he has responded well to this in the past -If Chest x-ray does not suggest pneumonia, then likely viral in etiology, and patient should call clinic for antibiotics if symptoms do not improve in one week  ADDENDUM: CXR clear, results conveyed to patient's father (patient reported this would be appropriate); will call clinic if no improvement in 1 week

## 2012-08-22 NOTE — Assessment & Plan Note (Signed)
Patient still needs to get orange card, he reports that he needs to gather some papers for completion. Once he is approved for the orange card, he would benefit from evaluation by surgery.

## 2012-08-28 ENCOUNTER — Encounter (HOSPITAL_COMMUNITY): Payer: Self-pay | Admitting: *Deleted

## 2012-08-28 ENCOUNTER — Emergency Department (HOSPITAL_COMMUNITY)
Admission: EM | Admit: 2012-08-28 | Discharge: 2012-08-29 | Disposition: A | Discharge status: Home / Self Care | Payer: Self-pay | Attending: Emergency Medicine | Admitting: Emergency Medicine

## 2012-08-28 DIAGNOSIS — T7840XA Allergy, unspecified, initial encounter: Secondary | ICD-10-CM

## 2012-08-28 DIAGNOSIS — R112 Nausea with vomiting, unspecified: Secondary | ICD-10-CM | POA: Insufficient documentation

## 2012-08-28 DIAGNOSIS — R0602 Shortness of breath: Secondary | ICD-10-CM | POA: Insufficient documentation

## 2012-08-28 DIAGNOSIS — L299 Pruritus, unspecified: Secondary | ICD-10-CM | POA: Insufficient documentation

## 2012-08-28 DIAGNOSIS — L509 Urticaria, unspecified: Secondary | ICD-10-CM | POA: Insufficient documentation

## 2012-08-28 DIAGNOSIS — Z8719 Personal history of other diseases of the digestive system: Secondary | ICD-10-CM | POA: Insufficient documentation

## 2012-08-28 DIAGNOSIS — R21 Rash and other nonspecific skin eruption: Secondary | ICD-10-CM | POA: Insufficient documentation

## 2012-08-28 DIAGNOSIS — F17200 Nicotine dependence, unspecified, uncomplicated: Secondary | ICD-10-CM | POA: Insufficient documentation

## 2012-08-28 DIAGNOSIS — T4995XA Adverse effect of unspecified topical agent, initial encounter: Secondary | ICD-10-CM | POA: Insufficient documentation

## 2012-08-28 MED ORDER — DIPHENHYDRAMINE HCL 50 MG/ML IJ SOLN
25.0000 mg | Freq: Once | INTRAMUSCULAR | Status: AC
  Administered 2012-08-28: 25 mg via INTRAVENOUS
  Filled 2012-08-28: qty 1

## 2012-08-28 MED ORDER — FAMOTIDINE IN NACL 20-0.9 MG/50ML-% IV SOLN
20.0000 mg | Freq: Once | INTRAVENOUS | Status: AC
  Administered 2012-08-28: 20 mg via INTRAVENOUS
  Filled 2012-08-28: qty 50

## 2012-08-28 MED ORDER — SODIUM CHLORIDE 0.9 % IV BOLUS (SEPSIS)
1000.0000 mL | Freq: Once | INTRAVENOUS | Status: AC
  Administered 2012-08-28: 1000 mL via INTRAVENOUS

## 2012-08-28 MED ORDER — METHYLPREDNISOLONE SODIUM SUCC 125 MG IJ SOLR
125.0000 mg | Freq: Once | INTRAMUSCULAR | Status: AC
  Administered 2012-08-28: 125 mg via INTRAVENOUS
  Filled 2012-08-28: qty 2

## 2012-08-28 MED ORDER — EPINEPHRINE 0.3 MG/0.3ML IJ DEVI
0.3000 mg | Freq: Once | INTRAMUSCULAR | Status: AC
  Administered 2012-08-28: 0.3 mg via INTRAMUSCULAR
  Filled 2012-08-28: qty 0.3

## 2012-08-28 NOTE — ED Provider Notes (Signed)
History     CSN: [id number]  Arrival date & time 08/28/12  2144   First MD Initiated Contact with Patient 08/28/12 2203      Chief Complaint  Patient presents with  . Allergic Reaction    (Consider location/radiation/quality/duration/timing/severity/associated sxs/prior treatment) Patient is a 37 y.o. male presenting with allergic reaction. The history is provided by the patient.  Allergic Reaction The primary symptoms are  shortness of breath, nausea, vomiting, rash and urticaria. The primary symptoms do not include wheezing, abdominal pain, diarrhea or palpitations. The current episode started 1 to 2 hours ago. The problem has not changed since onset.This is a new problem.  The shortness of breath began today.  The rash is associated with itching.   The onset of the reaction was associated with eating. Significant symptoms also include itching. Significant symptoms that are not present include rhinorrhea.    Past Medical History  Diagnosis Date  . Cholelithiasis     CT abd 06/2010    Past Surgical History  Procedure Date  . Colostomy     As a baby, does not know why\  . Colon surgery     Family History  Problem Relation Age of Onset  . Lupus Father   . Hypertension Mother     History  Substance Use Topics  . Smoking status: Current Every Day Smoker -- 0.1 packs/day    Types: Cigarettes  . Smokeless tobacco: Not on file  . Alcohol Use: No      Review of Systems  Constitutional: Negative for fever and fatigue.  HENT: Negative for congestion, rhinorrhea and postnasal drip.   Eyes: Negative for photophobia and visual disturbance.  Respiratory: Positive for shortness of breath. Negative for chest tightness and wheezing.   Cardiovascular: Negative for chest pain, palpitations and leg swelling.  Gastrointestinal: Positive for nausea and vomiting. Negative for abdominal pain and diarrhea.  Genitourinary: Negative for urgency, frequency and difficulty urinating.    Musculoskeletal: Negative for back pain and arthralgias.  Skin: Positive for itching and rash. Negative for wound.  Neurological: Negative for weakness and headaches.  Psychiatric/Behavioral: Negative for confusion and agitation.    Allergies  Shellfish allergy  Home Medications   Current Outpatient Rx  Name  Route  Sig  Dispense  Refill  . EPINEPHRINE 0.3 MG/0.3ML IJ DEVI   Intramuscular   Inject 0.3 mLs (0.3 mg total) into the muscle as needed.   1 Device   2   . PREDNISONE 50 MG PO TABS   Oral   Take 1 tablet (50 mg total) by mouth daily.   4 tablet   0     BP 121/58  Pulse 136  Temp 98.2 F (36.8 C) (Oral)  Resp 18  SpO2 98%  Physical Exam  Constitutional: He is oriented to person, place, and time. He appears well-developed and well-nourished. No distress.  HENT:  Head: Normocephalic and atraumatic.  Mouth/Throat: Oropharynx is clear and moist.       No angioedema noted  Eyes: EOM are normal. Pupils are equal, round, and reactive to light.  Neck: Normal range of motion. Neck supple.  Cardiovascular: Normal rate, regular rhythm, normal heart sounds and intact distal pulses.   Pulmonary/Chest: Effort normal and breath sounds normal. He has no wheezes. He has no rales.       No wheezing  Abdominal: Soft. Bowel sounds are normal. He exhibits no distension. There is no tenderness. There is no rebound and no guarding.  Vomiting at bedside.  Musculoskeletal: Normal range of motion. He exhibits no edema and no tenderness.  Lymphadenopathy:    He has no cervical adenopathy.  Neurological: He is alert and oriented to person, place, and time. He displays normal reflexes. No cranial nerve deficit. He exhibits normal muscle tone. Coordination normal.  Skin: Skin is warm and dry. No rash noted.  Psychiatric: He has a normal mood and affect. His behavior is normal.    ED Course  Procedures (including critical care time)  Labs Reviewed - No data to display No  results found.   1. Allergic reaction       MDM  66M with history of shellfish allergy here with anaphylaxis reaction tonight after eating dinner. Did not eat any shellfish. He developed itchy rash, started vomiting, and felt like his throat was closing. On arrival, 02 sat normal. No wheezing. No obvious angioedema noted. Diffuse mild urticaria. Will given epinephrine 0.3 mg IM, Solumedrol, Benadryl, and Pepcid. Given IVF. Will observe for 4 hours.   Pt doing better. He has been observed for about 3.5 hours. Felt stable for d/c home. Will d/c with steroid burst and epipens. F/u with pcp. Return precautions given.        Johnnette Gourd, MD 08/29/12 [phone]

## 2012-08-28 NOTE — ED Notes (Signed)
Itching all over with a headache after eating some food.  Sl difficulty breathing.

## 2012-08-29 MED ORDER — PREDNISONE 50 MG PO TABS: 50.0000 mg | ORAL_TABLET | Freq: Every day | ORAL | Status: AC

## 2012-08-29 MED ORDER — PREDNISONE 50 MG PO TABS: 50.0000 mg | ORAL_TABLET | Freq: Every day | ORAL | Status: DC

## 2012-08-29 MED ORDER — EPINEPHRINE 0.3 MG/0.3ML IJ DEVI: 0.3000 mg | Freq: Once | INTRAMUSCULAR | Status: DC

## 2012-08-29 MED ORDER — EPINEPHRINE 0.3 MG/0.3ML IJ DEVI: 0.3000 mg | INTRAMUSCULAR | Status: DC | PRN

## 2012-08-29 NOTE — ED Provider Notes (Signed)
I saw and evaluated the patient, reviewed the resident's note and I agree with the findings and plan. Patient with an allergic reaction most likely to a food product to initially when he arrived was complaining of shortness of breath with vomiting. He was given FE, steroids and Benadryl with significant improvement  Gwyneth Sprout, MD 08/29/12 2337

## 2012-09-06 ENCOUNTER — Encounter: Payer: Self-pay | Admitting: Licensed Clinical Social Worker

## 2012-09-06 NOTE — Progress Notes (Signed)
Mr. Burdin presents as walk-in today for ostomy supplies.  CSW met with Mr. Lautner.  Pt states he received supply from [facility] but did not receive information to complete his patient assistance packet.  CSW placed call to [employer]: [phone].  [employer] has yet to receive Mr. Jaggers tax information/food stamp/income verification.  [employer] states they will send out another supply sample to pt's mailing/temporary address.  This is the only information needed to complete patient assistance application.  Mr. Lloyd instructed by CSW to obtain food stamp verification.  CSW will mail in to complete patient's application.  Information to be sent to: [employer], [address] [place], [place] [address]. CSW was able to provide Mr. Longenecker with 3 pouches [facility] [phone]), a tube of barrier cream, bar of soap, and 4 one way bus fare.  CSW informed Mr. Risby, this is the last time [facility] will be able to offer free supplies as pt will need to show responsibility for his medical care.  Currently, Mr. Maring has lacked follow through on obtaining necessary forms.  Pt provided with transportation means to obtain paperwork need to complete both [employer] and [employer] application.  Once [facility] application completed, pt aware CSW will sign pt up for [facility] medical transportation.

## 2014-11-06 ENCOUNTER — Telehealth: Payer: Self-pay | Admitting: Internal Medicine

## 2014-11-06 NOTE — Telephone Encounter (Signed)
Call to patient to confirm appointment for 11/07/14 at 2:00 lmtcb

## 2014-11-07 ENCOUNTER — Encounter: Payer: Self-pay | Admitting: Internal Medicine

## 2014-11-07 ENCOUNTER — Ambulatory Visit: Payer: Self-pay | Admitting: Internal Medicine

## 2015-10-29 ENCOUNTER — Emergency Department (HOSPITAL_COMMUNITY)
Admission: EM | Admit: 2015-10-29 | Discharge: 2015-10-29 | Disposition: A | Discharge status: Home / Self Care | Payer: Medicaid Other | Attending: Emergency Medicine | Admitting: Emergency Medicine

## 2015-10-29 ENCOUNTER — Encounter (HOSPITAL_COMMUNITY): Payer: Self-pay | Admitting: Emergency Medicine

## 2015-10-29 DIAGNOSIS — Z8719 Personal history of other diseases of the digestive system: Secondary | ICD-10-CM | POA: Diagnosis not present

## 2015-10-29 DIAGNOSIS — M545 Low back pain: Secondary | ICD-10-CM | POA: Diagnosis present

## 2015-10-29 DIAGNOSIS — Y998 Other external cause status: Secondary | ICD-10-CM | POA: Insufficient documentation

## 2015-10-29 DIAGNOSIS — X58XXXA Exposure to other specified factors, initial encounter: Secondary | ICD-10-CM | POA: Insufficient documentation

## 2015-10-29 DIAGNOSIS — Z933 Colostomy status: Secondary | ICD-10-CM | POA: Diagnosis not present

## 2015-10-29 DIAGNOSIS — Y9289 Other specified places as the place of occurrence of the external cause: Secondary | ICD-10-CM | POA: Diagnosis not present

## 2015-10-29 DIAGNOSIS — S39012A Strain of muscle, fascia and tendon of lower back, initial encounter: Secondary | ICD-10-CM | POA: Diagnosis not present

## 2015-10-29 DIAGNOSIS — Y9389 Activity, other specified: Secondary | ICD-10-CM | POA: Diagnosis not present

## 2015-10-29 DIAGNOSIS — F17210 Nicotine dependence, cigarettes, uncomplicated: Secondary | ICD-10-CM | POA: Insufficient documentation

## 2015-10-29 MED ORDER — METHOCARBAMOL 500 MG PO TABS: 500.0000 mg | ORAL_TABLET | Freq: Two times a day (BID) | ORAL | Status: DC

## 2015-10-29 MED ORDER — IBUPROFEN 800 MG PO TABS: 800.0000 mg | ORAL_TABLET | Freq: Three times a day (TID) | ORAL | Status: DC

## 2015-10-29 NOTE — ED Provider Notes (Signed)
CSN: [id number]     Arrival date & time 10/29/15  1249 History  By signing my name below, I, Rickey Fisher, attest that this documentation has been prepared under the direction and in the presence of Fayrene Helper, PA-C. Electronically Signed: Placido Fisher, ED Scribe. 10/29/2015. 4:11 PM.   Chief Complaint  Patient presents with  . Back Pain   The history is provided by the patient. No language interpreter was used.    HPI Comments: Rickey Fisher is a 40 y.o. male who presents to the Emergency Department complaining of constant, 10/10, aching, atraumatic, lower back pain that radiates to his left gluteal region onset 2 weeks ago. His pain worsens with movement or when sitting and notes 1x episode of right leg weakness for ~10 minutes due to pain and confirms ambulating since this incident. Pt reports taking tylenol and ibuprofen without significant relief. He reports working in [employer] and denies any known injuries. He denies a hx of similar symptoms. Pt confirms his listed SHx and denies any acute issues with his colostomy bag. Pt denies a hx of IVDA or a PMHx of CA. Pt denies numbness, tingling, n/v, fevers, chills, body aches, dysuria, decreased urinary frequency, incontinence of his bowels or bladder, saddle anesthesia, hematuria or any other associated symptoms at this time.   Past Medical History  Diagnosis Date  . Cholelithiasis     CT abd 06/2010   Past Surgical History  Procedure Laterality Date  . 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 Colon surgery     Family History  Problem Relation Age of Onset  . Lupus Father   . Hypertension Mother    Social History  Substance Use Topics  . Smoking status: Current Every Day Smoker -- 0.10 packs/day    Types: Cigarettes  . Smokeless tobacco: None  . Alcohol Use: No    Review of Systems  Constitutional: Negative for fever and chills.  Gastrointestinal: Negative for nausea and vomiting.  Genitourinary: Negative for  dysuria, frequency, decreased urine volume and difficulty urinating.  Musculoskeletal: Positive for myalgias and back pain.  Skin: Negative for wound.  Neurological: Negative for numbness.   Allergies  Shellfish allergy  Home Medications   Prior to Admission medications   Medication Sig Start Date End Date Taking? Authorizing Provider  EPINEPHrine (EPI-PEN) 0.3 mg/0.3 mL DEVI Inject 0.3 mLs (0.3 mg total) into the muscle once. 08/29/12   Johnnette Gourd, MD   BP 132/89 mmHg  Pulse 74  Temp(Src) 98.2 F (36.8 C) (Oral)  Resp 16  SpO2 98% Physical Exam  Constitutional: He is oriented to person, place, and time. He appears well-developed and well-nourished.  HENT:  Head: Normocephalic and atraumatic.  Eyes: EOM are normal.  Neck: Normal range of motion.  Cardiovascular: Normal rate, regular rhythm and normal heart sounds.  Exam reveals no gallop and no friction rub.   No murmur heard. Pulmonary/Chest: Effort normal and breath sounds normal. No respiratory distress. He has no wheezes. He has no rales.  Clear to auscultation bilaterally  Abdominal: Soft.  Colostomy bag on RLQ nml appearing  Musculoskeletal: He exhibits tenderness.  lumbar paraspinal muscle tenderness to palpation; no significant midline spinal tenderness without step offs, crepitus, or overlying skin changes; decreased back rotation secondary to pain  Neurological: He is alert and oriented to person, place, and time.  Reflex Scores:      Patellar reflexes are 2+ on the right side and 2+ on  the left side. Able to ambulate with steady gait  Skin: Skin is warm and dry.  Psychiatric: He has a normal mood and affect.  Nursing note and vitals reviewed.  ED Course  Procedures  DIAGNOSTIC STUDIES: Oxygen Saturation is 98% on RA, normal by my interpretation.    COORDINATION OF CARE: 4:09 PM Pt presents today due to lower back pain. NO red flags.  Able to ambulate.  No signs of infection.  Discussed treatment plan with pt  at bedside. Return precautions noted. Pt agreed to plan.    MDM   Final diagnoses:  Low back strain, initial encounter    BP 132/89 mmHg  Pulse 74  Temp(Src) 98.2 F (36.8 C) (Oral)  Resp 16  SpO2 98%   I personally performed the services described in this documentation, which was scribed in my presence. The recorded information has been reviewed and is accurate.      Fayrene HelperBowie Feliciano Wynter, PA-C 10/29/15 1620  Gerhard Munchobert Lockwood, MD 10/29/15 [phone]

## 2015-10-29 NOTE — Discharge Instructions (Signed)

## 2015-10-29 NOTE — ED Notes (Signed)
Patient here with complaints of lower back pain. States he does [employer] and may have turned wrong. Pain 10/10. States that he has been taking OTC medications no relief.

## 2016-01-26 ENCOUNTER — Encounter (HOSPITAL_COMMUNITY): Payer: Self-pay

## 2016-01-26 ENCOUNTER — Emergency Department (HOSPITAL_COMMUNITY)
Admission: EM | Admit: 2016-01-26 | Discharge: 2016-01-26 | Disposition: A | Discharge status: Home / Self Care | Payer: Medicaid Other | Attending: Emergency Medicine | Admitting: Emergency Medicine

## 2016-01-26 ENCOUNTER — Emergency Department (HOSPITAL_COMMUNITY): Payer: Medicaid Other

## 2016-01-26 DIAGNOSIS — F17210 Nicotine dependence, cigarettes, uncomplicated: Secondary | ICD-10-CM | POA: Diagnosis not present

## 2016-01-26 DIAGNOSIS — M545 Low back pain, unspecified: Secondary | ICD-10-CM

## 2016-01-26 MED ORDER — KETOROLAC TROMETHAMINE 60 MG/2ML IM SOLN
60.0000 mg | Freq: Once | INTRAMUSCULAR | Status: AC
  Administered 2016-01-26: 60 mg via INTRAMUSCULAR
  Filled 2016-01-26: qty 2

## 2016-01-26 MED ORDER — METHOCARBAMOL 500 MG PO TABS: 500.0000 mg | ORAL_TABLET | Freq: Two times a day (BID) | ORAL | Status: DC

## 2016-01-26 MED ORDER — LIDOCAINE 5 % EX PTCH
2.0000 | MEDICATED_PATCH | CUTANEOUS | Status: DC
  Administered 2016-01-26: 2 via TRANSDERMAL
  Filled 2016-01-26: qty 2

## 2016-01-26 MED ORDER — METHOCARBAMOL 500 MG PO TABS
1000.0000 mg | ORAL_TABLET | Freq: Once | ORAL | Status: AC
  Administered 2016-01-26: 1000 mg via ORAL
  Filled 2016-01-26: qty 2

## 2016-01-26 MED ORDER — NAPROXEN 500 MG PO TABS: 500.0000 mg | ORAL_TABLET | Freq: Two times a day (BID) | ORAL | Status: DC

## 2016-01-26 MED ORDER — OXYCODONE-ACETAMINOPHEN 5-325 MG PO TABS: 1.0000 | ORAL_TABLET | ORAL | Status: DC | PRN

## 2016-01-26 MED ORDER — OXYCODONE-ACETAMINOPHEN 5-325 MG PO TABS
1.0000 | ORAL_TABLET | Freq: Once | ORAL | Status: AC
  Administered 2016-01-26: 1 via ORAL
  Filled 2016-01-26: qty 1

## 2016-01-26 MED ORDER — LIDOCAINE 5 % EX PTCH: 1.0000 | MEDICATED_PATCH | CUTANEOUS | Status: DC

## 2016-01-26 NOTE — Discharge Instructions (Signed)
You have been seen today for back pain. Your imaging showed no acute abnormalities. Follow up with neurosurgery as soon as possible. Call the number provided to set up an appointment. Follow up with PCP as needed. Return to ED should symptoms worsen.  Expect your soreness to increase over the next 2-3 days. Take it easy, but do not lay around too much as this may make the stiffness worse. Take 500 mg of naproxen every 12 hours or 800 mg of ibuprofen every 8 hours for the next 3 days. Take these medications with food to avoid upset stomach. Robaxin is a muscle relaxer and may help loosen stiff muscles. Percocet for severe pain. Do not take the Robaxin or Percocet while driving or performing other dangerous activities.

## 2016-01-26 NOTE — ED Notes (Signed)
Patient complains of increased thoracic back pain the past few days, pain with change in position. denies trauma

## 2016-01-26 NOTE — ED Provider Notes (Signed)
CSN: [id number]     Arrival date & time 01/26/16  1218 History  By signing my name below, I, Essence Howell, attest that this documentation has been prepared under the direction and in the presence of Harolyn RutherfordShawn Joy, PA-C Electronically Signed: Charline BillsEssence Howell, ED Scribe 01/26/2016 at 3:20 PM.   Chief Complaint  Patient presents with  . Back Pain   The history is provided by the patient. No language interpreter was used.   HPI Comments: Rickey Fisher is a 40 y.o. male who presents to the Emergency Department complaining of constant back pain for the past 4 months, worsened over the past few days. Pt reports moderates pain that radiates from his low back downward and is worsened with movement. Pt has been evaluated by Dr. Eulah PontMurphy for similar pain, diagnosed with scoliosis and prescribed Norco 5 which he recently ran out of. He states that he has [facility]. Pt denies neuro deficits, fever/chills, nausea/vomiting, falls/trauma, or any other complaints.   Past Medical History  Diagnosis Date  . Cholelithiasis     CT abd 06/2010   Past Surgical History  Procedure Laterality Date  . 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 Colon surgery     Family History  Problem Relation Age of Onset  . Lupus Father   . Hypertension Mother    Social History  Substance Use Topics  . Smoking status: Current Every Day Smoker -- 0.10 packs/day    Types: Cigarettes  . Smokeless tobacco: None  . Alcohol Use: No    Review of Systems  Constitutional: Negative for fever and chills.  Musculoskeletal: Positive for back pain. Negative for neck pain.  Neurological: Negative for dizziness, weakness, light-headedness, numbness and headaches.   Allergies  Shellfish allergy  Home Medications   Prior to Admission medications   Medication Sig Start Date End Date Taking? Authorizing Provider  EPINEPHrine (EPI-PEN) 0.3 mg/0.3 mL DEVI Inject 0.3 mLs (0.3 mg  total) into the muscle once. 08/29/12   Johnnette GourdErin Boyd, MD  ibuprofen (ADVIL,MOTRIN) 800 MG tablet Take 1 tablet (800 mg total) by mouth 3 (three) times daily. 10/29/15   Fayrene HelperBowie Tran, PA-C  lidocaine (LIDODERM) 5 % Place 1 patch onto the skin daily. Remove & Discard patch within 12 hours or as directed by MD 01/26/16   Anselm PancoastShawn C Joy, PA-C  methocarbamol (ROBAXIN) 500 MG tablet Take 1 tablet (500 mg total) by mouth 2 (two) times daily. 10/29/15   Fayrene HelperBowie Tran, PA-C  methocarbamol (ROBAXIN) 500 MG tablet Take 1 tablet (500 mg total) by mouth 2 (two) times daily. 01/26/16   Shawn C Joy, PA-C  naproxen (NAPROSYN) 500 MG tablet Take 1 tablet (500 mg total) by mouth 2 (two) times daily. 01/26/16   Shawn C Joy, PA-C  oxyCODONE-acetaminophen (PERCOCET/ROXICET) 5-325 MG tablet Take 1-2 tablets by mouth every 4 (four) hours as needed for severe pain. 01/26/16   Shawn C Joy, PA-C   BP 120/92 mmHg  Pulse 104  Temp(Src) 98 F (36.7 C)  Resp 18  SpO2 100% Physical Exam  Constitutional: He is oriented to person, place, and time. He appears well-developed and well-nourished. No distress.  HENT:  Head: Normocephalic and atraumatic.  Eyes: Conjunctivae are normal.  Neck: Neck supple.  Cardiovascular: Normal rate and regular rhythm.   Pulmonary/Chest: Effort normal.  Musculoskeletal: Normal range of motion.  Scoliotic spine. Tightness and tenderness to the musculature flanking the lumbar  spine. Full ROM in all extremities and spine. No paraspinal tenderness.   Neurological: He is alert and oriented to person, place, and time. He has normal reflexes.  No sensory deficits. Strength 5/5 in all extremities. No gait disturbance. Coordination intact.  Skin: Skin is warm and dry. He is not diaphoretic.  Psychiatric: He has a normal mood and affect. His behavior is normal.  Nursing note and vitals reviewed.  ED Course  Procedures (including critical care time) DIAGNOSTIC STUDIES: Oxygen Saturation is 100% on RA, normal by my  interpretation.    COORDINATION OF CARE: 1:58 PM-Discussed treatment plan which includes XR, Toradol injection, Robaxin, Percocet, Lidoderm, Naproxen and f/u neurosurgeon with pt at bedside and pt agreed to plan.    Imaging Review Dg Thoracic Spine 2 View  01/26/2016  CLINICAL DATA:  Back pain for several days. EXAM: THORACIC SPINE 2 VIEWS COMPARISON:  Chest x-ray dated 08/22/2012 FINDINGS: There is a slight thoracic scoliosis, unchanged since the prior chest x-ray. No mass lesions or bone destruction or fracture. Reversal of the normal cervical lordosis. IMPRESSION: No acute abnormality of the thoracic spine. Slight stable scoliosis. Electronically Signed   By: Francene Boyers M.D.   On: 01/26/2016 15:05   Dg Lumbar Spine Complete  01/26/2016  CLINICAL DATA:  Back pain for 3 days. EXAM: LUMBAR SPINE - COMPLETE 4+ VIEW COMPARISON:  CT scan of the abdomen and pelvis dated 07/03/2010 FINDINGS: There is a chronic thoracolumbar scoliosis, unchanged since the prior CT scan. Degenerative disc disease at L4-5. Slight facet arthritis at L3-4 L4-5. No fracture or subluxation. IMPRESSION: Chronic changes of the lumbar spine, stable. Stable thoracolumbar scoliosis. Electronically Signed   By: Francene Boyers M.D.   On: 01/26/2016 15:04   I have personally reviewed and evaluated these images as part of my medical decision-making.   EKG Interpretation None      MDM   Final diagnoses:  Bilateral low back pain without sciatica    Rickey Fisher presents with acute on chronic lower back pain, worse over the last few days.  No neuro or functional deficits. Patient has follow-up evaluation set up. Patient advised to return to Dr. Eulah Pont for further management. Return precautions discussed. Patient voiced understanding of these instructions and is comfortable with discharge.  Filed Vitals:   01/26/16 1240 01/26/16 1529  BP: 120/92 123/89  Pulse: 104 71  Temp: 98 F (36.7 C)   Resp: 18 18  SpO2: 100%  100%     I personally performed the services described in this documentation, which was scribed in my presence. The recorded information has been reviewed and is accurate.   Anselm Pancoast, PA-C 01/26/16 1729  Azalia Bilis, MD 01/27/16 [phone]

## 2016-02-13 ENCOUNTER — Ambulatory Visit: Payer: Self-pay | Attending: Internal Medicine

## 2016-03-19 ENCOUNTER — Ambulatory Visit: Payer: Self-pay

## 2016-03-22 ENCOUNTER — Encounter: Payer: Self-pay | Admitting: Physician Assistant

## 2016-03-22 ENCOUNTER — Ambulatory Visit: Payer: Self-pay | Attending: Internal Medicine | Admitting: Physician Assistant

## 2016-03-22 VITALS — BP 117/75 | HR 69 | Temp 98.3°F | Wt 140.4 lb

## 2016-03-22 DIAGNOSIS — Z131 Encounter for screening for diabetes mellitus: Secondary | ICD-10-CM

## 2016-03-22 DIAGNOSIS — M549 Dorsalgia, unspecified: Secondary | ICD-10-CM

## 2016-03-22 LAB — GLUCOSE, POCT (MANUAL RESULT ENTRY): POC GLUCOSE: 82 mg/dL (ref 70–99)

## 2016-03-22 MED ORDER — METHOCARBAMOL 500 MG PO TABS: 1000.0000 mg | ORAL_TABLET | Freq: Three times a day (TID) | ORAL | 1 refills | Status: DC

## 2016-03-22 MED ORDER — IBUPROFEN 600 MG PO TABS: 600.0000 mg | ORAL_TABLET | Freq: Three times a day (TID) | ORAL | 0 refills | Status: DC | PRN

## 2016-03-22 MED ORDER — ACETAMINOPHEN-CODEINE #3 300-30 MG PO TABS: 1.0000 | ORAL_TABLET | ORAL | 0 refills | Status: DC | PRN

## 2016-03-22 MED ORDER — ACETAMINOPHEN-CODEINE #4 300-60 MG PO TABS: 1.0000 | ORAL_TABLET | Freq: Four times a day (QID) | ORAL | 0 refills | Status: DC | PRN

## 2016-03-22 MED FILL — IBUPROFEN 600 MG TABLET: 600 | 30 days supply | Qty: 60 | Fill #0

## 2016-03-22 MED FILL — METHOCARBAMOL 500 MG TABLET: 500 | 30 days supply | Qty: 120 | Fill #0

## 2016-03-22 MED FILL — ACETAMINOPHEN/COD #3 TABLET: 300-30 | 7 days supply | Qty: 30 | Fill #0

## 2016-03-22 NOTE — Patient Instructions (Signed)

## 2016-03-22 NOTE — Progress Notes (Signed)
Rickey Fisher, is a 40 y.o. male  GNF:[id number]:[id number]  QIO:[id number]  DOB - 02/21/1976  Subjective:  Chief Complaint and HPI: Rickey Fisher is a 40 y.o. male here today to establish care and for a follow up visit for ongoing thoracolumbar back pain.  Poor historian and difficult to obtain history.  History is somewhat inconsistent as he had told the ED provider he has surgery scheduled for November [facility], but he tells me that is is scheduled for September 2018 and that he has no follow-up appointments between now and then.  He also does not know what type surgery he will be having.  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  He says the pain is across his entire upper, middle, and lower back.  He is aware that he has scoliosis.  He denies numbness and weakness other than occasional numbness in his R hand/arm which is new.  He denies any injury that precipitated the back pain.  The pain has been going on about 1 year.  He says percocet and methocarbamol has helped with the pain.  Naproxen doesnt help.  He says the pain is bad enough to warrant taking medication daily.  He does have the [employer].   Reviewed xrays from 01/26/2016: Lumbar spine: There is a chronic thoracolumbar scoliosis, unchanged since the prior CT scan. Degenerative disc disease at L4-5. Slight facet arthritis at L3-4 L4-5. No fracture or subluxation.  Thoracic spine:No acute abnormality of the thoracic spine. Slight stable scoliosis.  ED notes reviewed.     ROS:   Constitutional:  No f/c, No night sweats, No unexplained weight loss. EENT:  No vision changes, No blurry vision, No hearing changes. No mouth, throat, or ear problems.  Respiratory: No cough, No SOB Cardiac: No CP, no palpitations GI:  No abd pain, No N/V/D. GU: No Urinary s/sx Musculoskeletal: + back pain Neuro: No headache, no dizziness, no motor weakness.  Skin: No rash Endocrine:  No  polydipsia. No polyuria.  Psych: Denies SI/HI  No problems updated.  ALLERGIES: Allergies  Allergen Reactions  . Shellfish Allergy Anaphylaxis    PAST MEDICAL HISTORY: Past Medical History:  Diagnosis Date  . Cholelithiasis    CT abd 06/2010    MEDICATIONS AT HOME: Prior to Admission medications   Medication Sig Start Date End Date Taking? Authorizing Provider  acetaminophen-codeine (TYLENOL #4) 300-60 MG tablet Take 1 tablet by mouth every 6 (six) hours as needed for moderate pain. 03/22/16   Anders SimmondsAngela M Magdelena Kinsella, PA-C  EPINEPHrine (EPI-PEN) 0.3 mg/0.3 mL DEVI Inject 0.3 mLs (0.3 mg total) into the muscle once. Patient not taking: Reported on 03/22/2016 08/29/12   Johnnette GourdErin Boyd, MD  ibuprofen (ADVIL,MOTRIN) 600 MG tablet Take 1 tablet (600 mg total) by mouth every 8 (eight) hours as needed. 03/22/16   Anders SimmondsAngela M Sofhia Ulibarri, PA-C  lidocaine (LIDODERM) 5 % Place 1 patch onto the skin daily. Remove & Discard patch within 12 hours or as directed by MD Patient not taking: Reported on 03/22/2016 01/26/16   Shawn C Joy, PA-C  methocarbamol (ROBAXIN) 500 MG tablet Take 2 tablets (1,000 mg total) by mouth 3 (three) times daily. 03/22/16   Anders SimmondsAngela M Lisel Siegrist, PA-C     Objective:  EXAM:   Vitals:   03/22/16 1146  BP: 117/75  Pulse: 69  Temp: 98.3 F (36.8 C)  TempSrc: Oral  Weight: 140 lb 6.4 oz (63.7 kg)  General appearance : A&OX3. NAD. Non-toxic-appearing HEENT: Atraumatic and Normocephalic.  PERRLA. EOM intact.  TM clear B. Mouth-MMM, post pharynx WNL w/o erythema, No PND. Neck: supple, no JVD. No cervical lymphadenopathy. No thyromegaly Chest/Lungs:  Breathing-non-labored, Good air entry bilaterally, breath sounds normal without rales, rhonchi, or wheezing  CVS: S1 S2 regular, no murmurs, gallops, rubs  Back: No spiny TTP.  Some paraspinus spasm R&L of thoraco-lumbar spine.  Full S&ROM.  Neg SLRB. Extremities: Bilateral Lower Ext shows no edema, both legs are warm to touch with = pulse  throughout Neurology:  CN II-XII grossly intact, Non focal.  DTR=B U&L ext Psych:  TP linear. J/I WNL. Normal speech. Appropriate eye contact and affect.  Skin:  No Rash  Data Review No results found for: HGBA1C   Assessment & Plan   1. Back pain without radiation - methocarbamol (ROBAXIN) 500 MG tablet; Take 2 tablets (1,000 mg total) by mouth 3 (three) times daily.  Dispense: 120 tablet; Refill: 1 - ibuprofen (ADVIL,MOTRIN) 600 MG tablet; Take 1 tablet (600 mg total) by mouth every 8 (eight) hours as needed.  Dispense: 60 tablet; Refill: 0 - acetaminophen-codeine (TYLENOL #3) 300-30 MG tablet; Take 1 tablet by mouth every 6 (six) hours as needed for moderate pain.  Dispense: 30 tablet; Refill: 0 - Ambulatory referral to Pain Clinic  2. Screening for diabetes mellitus - POCT glucose (manual entry)-not diabetic   Patient have been counseled extensively about nutrition and exercise  Return in about 2 months (around 05/22/2016) for CPE and establish with PCP.  The patient was given clear instructions to go to ER or return to medical center if symptoms don't improve, worsen or new problems develop. The patient verbalized understanding. The patient was told to call to get lab results if they haven't heard anything in the next week.     Georgian CoAngela Giovanna Kemmerer, PA-C [facility] [place], [place] [phone]   03/22/2016, 12:31 PM

## 2016-03-22 NOTE — Progress Notes (Signed)
Pt states he is here as an ED follow up from 01/26/2016.  Pt states he is still in a lot of pain ranging between a 8-9 out of 10. Pt states he does not have insurance which causes delay in his care for his back.

## 2016-05-07 ENCOUNTER — Encounter: Payer: Self-pay | Admitting: Physical Medicine & Rehabilitation

## 2016-05-07 ENCOUNTER — Ambulatory Visit (HOSPITAL_BASED_OUTPATIENT_CLINIC_OR_DEPARTMENT_OTHER): Payer: Medicaid Other | Admitting: Physical Medicine & Rehabilitation

## 2016-05-07 ENCOUNTER — Encounter: Payer: Medicaid Other | Attending: Physical Medicine & Rehabilitation

## 2016-05-07 ENCOUNTER — Telehealth: Payer: Self-pay | Admitting: Family Medicine

## 2016-05-07 VITALS — BP 134/82 | HR 69 | Resp 14

## 2016-05-07 DIAGNOSIS — Z5181 Encounter for therapeutic drug level monitoring: Secondary | ICD-10-CM | POA: Insufficient documentation

## 2016-05-07 DIAGNOSIS — Z8719 Personal history of other diseases of the digestive system: Secondary | ICD-10-CM | POA: Diagnosis not present

## 2016-05-07 DIAGNOSIS — M419 Scoliosis, unspecified: Secondary | ICD-10-CM | POA: Insufficient documentation

## 2016-05-07 DIAGNOSIS — Z79899 Other long term (current) drug therapy: Secondary | ICD-10-CM | POA: Insufficient documentation

## 2016-05-07 DIAGNOSIS — Z8249 Family history of ischemic heart disease and other diseases of the circulatory system: Secondary | ICD-10-CM | POA: Insufficient documentation

## 2016-05-07 DIAGNOSIS — M4126 Other idiopathic scoliosis, lumbar region: Secondary | ICD-10-CM

## 2016-05-07 DIAGNOSIS — F17210 Nicotine dependence, cigarettes, uncomplicated: Secondary | ICD-10-CM | POA: Diagnosis not present

## 2016-05-07 DIAGNOSIS — M5441 Lumbago with sciatica, right side: Secondary | ICD-10-CM | POA: Insufficient documentation

## 2016-05-07 DIAGNOSIS — G894 Chronic pain syndrome: Secondary | ICD-10-CM

## 2016-05-07 NOTE — Progress Notes (Signed)
Subjective:    Patient ID: Rickey Fisher, male    DOB: April 23, 1976, 40 y.o.   MRN: [id number]  HPI 40 yo male with low back painMainly on the left side. Patient points to the mid back to the lower back area. Patient denies any radiation of pain toward his leg. Patient has had some knee pain. No weakness in the legs. Patient has had x-rays after ED visit and then orthopedic referral. This demonstrated thoracolumbar scoliosis. He has an appointment with [facility], but that is in approximately one year. Patient has recently obtained Medicaid, no physical therapy for this issue. Received Tylenol 3, 30 tablets on 03/22/2016, [facility], where he goes for primary care. Patient no longer works. He takes the Tylenol 3 with methocarbamol at night. Does not take these during the day. These make him drowsy. No heating pad tried thus far. Patient feels like he is leaning towards the left side.  Pain Inventory Average Pain 10 Pain Right Now 10 My pain is sharp  In the last 24 hours, has pain interfered with the following? General activity 10 Relation with others 10 Enjoyment of life 10 What TIME of day is your pain at its worst? all Sleep (in general) Good  Pain is worse with: walking, bending, sitting and standing Pain improves with: sleep Relief from Meds: 10 "because i sleep all the time"  Mobility walk without assistance ability to climb steps?  yes  Function disabled: date disabled 2017 I need assistance with the following:  household duties  Neuro/Psych weakness numbness trouble walking depression  Prior Studies Any changes since last visit?  no  Physicians involved in your care Any changes since last visit?  no   Family History  Problem Relation Age of Onset  . Lupus Father   . Hypertension Mother    Social History   Social History  . Marital status: Single    Spouse name: N/A  . Number of children: 0  . Years of  education: GED   Occupational History  .  Not Employed   Social History Main Topics  . Smoking status: Current Every Day Smoker    Packs/day: 0.10    Types: Cigarettes  . Smokeless tobacco: Never Used  . Alcohol use No  . Drug use:     Types: Marijuana  . Sexual activity: No   Other Topics Concern  . Not on file   Social History Narrative   Worked at [employer]; Grew up in [place]; Went to [employer], dropped out in 11th grade,but acquired GED   Lives with friends/family [place]"   Has 3 brothers, 1 sister - all in [place]   Mom & Dad in [place] as well.   Past Surgical History:  Procedure Laterality Date  . COLON SURGERY    . COLOSTOMY     As a baby, does not know why\   Past Medical History:  Diagnosis Date  . Cholelithiasis    CT abd 06/2010   BP 134/82   Pulse 69   Resp 14   SpO2 98%   Opioid Risk Score:  2 Fall Risk Score:  `1  Depression screen PHQ 2/9  Depression screen [facility] 2/9 05/07/2016 03/22/2016 08/22/2012 04/07/2012  Decreased Interest 3 3 0 1  Down, Depressed, Hopeless 3 2 0 1  PHQ - 2 Score 6 5 0 2  Altered sleeping 3 3 - -  Tired, decreased energy 3 1 - -  Change in appetite 3 - - -  Feeling bad or failure about yourself  3 2 - -  Trouble concentrating 3 0 - -  Moving slowly or fidgety/restless 3 0 - -  Suicidal thoughts 1 0 - -  PHQ-9 Score 25 11 - -  Difficult doing work/chores Extremely dIfficult Somewhat difficult - -    Review of Systems  Constitutional: Positive for appetite change and unexpected weight change.  Respiratory: Positive for shortness of breath.   Gastrointestinal:       Has had colostomy since child  All other systems reviewed and are negative.      Objective:   Physical Exam  Constitutional: He is oriented to person, place, and time. He appears well-developed and well-nourished.  HENT:  Head: Normocephalic and atraumatic.  Eyes: Conjunctivae and EOM are normal. Pupils are equal, round,  and reactive to light.  Neck: Normal range of motion.  Cardiovascular: Normal rate, regular rhythm and normal heart sounds.  Exam reveals no friction rub.   No murmur heard. Pulmonary/Chest: Effort normal and breath sounds normal. No respiratory distress. He has no wheezes.  Abdominal: Soft. Bowel sounds are normal. He exhibits no distension.  Colostomy  Musculoskeletal:       Right knee: Normal.       Left knee: Normal.       Lumbar back: He exhibits decreased range of motion and deformity.       Back:  Pelvic obliquity, right iliac crest elevated compared to left side  Mild tenderness to palpation in the right lumbar paraspinal muscles into the PSIS area    Neurological: He is alert and oriented to person, place, and time. He displays no atrophy. No sensory deficit. Gait abnormal.  Reflex Scores:      Tricep reflexes are 2+ on the right side and 2+ on the left side.      Bicep reflexes are 2+ on the right side and 2+ on the left side.      Brachioradialis reflexes are 2+ on the right side and 2+ on the left side.      Patellar reflexes are 2+ on the right side and 2+ on the left side.      Achilles reflexes are 2+ on the right side and 2+ on the left side. 5/5 strength in bilateral deltoid, biceps, triceps, grip, hip flexion, extensor, ankle dorsiflexor and plantar flexor  Antalgic gait. Forward flexed leaning toward the left  Negative straight leg raising  Sensation intact pinprick, bilateral L2, L3, L4, L5, S1 dermatomal distribution  Psychiatric: He has a normal mood and affect.  Nursing note and vitals reviewed.         Assessment & Plan:  1. Chronic right-sided low back pain without sciatica. He has scoliosis which has been present since at least 2011 and probably longer. As I discussed with the patient. He had some type of event, which aggravated the scoliosis and likely is experiencing lumbar facet pain primarily. Cannot rule out concomitant disc issues. He does not  have any signs of radiculopathy. He has gotten some partial relief with methocarbamol and Tylenol with codeine but this limits him due to drowsiness. Takes this mainly at night. Has not tried any nonpharmacologic means of managing pain. Has not tried heating pad, nor has he tried any physical therapy. Will order the physical therapy. We also discussed possibility of lumbar medial branch blocks to help decrease pain from the lumbar facet joints. If the physical therapy does not  prove to be very helpful.  Reviewed x-rays films with the patient discussed that with him as well as his aunt

## 2016-05-07 NOTE — Telephone Encounter (Signed)
Patient called the office to speak with PCP regarding his colospmy bags. Indicator, company needs reverification for it. Please contact them [phone]. Medicaid approved item.  Thank you.

## 2016-05-10 ENCOUNTER — Telehealth: Payer: Self-pay | Admitting: Family Medicine

## 2016-05-10 NOTE — Telephone Encounter (Signed)
Will route to PCP 

## 2016-05-10 NOTE — Telephone Encounter (Signed)
Patient is needing verbal orders for ostomy supplies

## 2016-05-11 NOTE — Telephone Encounter (Signed)
Please call back to company to give verbal order for colostomy supplies.

## 2016-05-11 NOTE — Telephone Encounter (Signed)
Will route to PCP 

## 2016-05-13 NOTE — Telephone Encounter (Signed)
Patient would like to speak with doctor. Patient needs order for ostomy bags

## 2016-05-15 LAB — 6-ACETYLMORPHINE,TOXASSURE ADD
6-ACETYLMORPHINE: NEGATIVE
6-acetylmorphine: NOT DETECTED ng/mg creat

## 2016-05-15 LAB — TOXASSURE SELECT,+ANTIDEPR,UR

## 2016-05-17 NOTE — Progress Notes (Signed)
Urine drug screen for this encounter is consistent for prescribed medication 

## 2016-05-17 NOTE — Telephone Encounter (Signed)
Please update patient  [employer] [place] [facility] form received and signed. The form needs supporting OV note, last  OV did not address need for ostomy supplies. Please hold onto form until patient completes f/u OV on 05/20/16.

## 2016-05-19 ENCOUNTER — Ambulatory Visit: Payer: Medicaid Other | Attending: Physical Medicine & Rehabilitation | Admitting: Physical Therapy

## 2016-05-19 DIAGNOSIS — M545 Low back pain, unspecified: Secondary | ICD-10-CM

## 2016-05-19 DIAGNOSIS — M62830 Muscle spasm of back: Secondary | ICD-10-CM | POA: Diagnosis present

## 2016-05-19 NOTE — Telephone Encounter (Signed)
Pt was called on 10/11 and no one answered the phone. Pt has an appointment tomorrow 10/12 will discuss paperwork at OV.

## 2016-05-20 ENCOUNTER — Ambulatory Visit: Payer: Medicaid Other | Attending: Family Medicine | Admitting: Family Medicine

## 2016-05-20 ENCOUNTER — Encounter: Payer: Self-pay | Admitting: Family Medicine

## 2016-05-20 VITALS — BP 135/81 | HR 69 | Temp 98.4°F | Ht 74.0 in | Wt 141.0 lb

## 2016-05-20 DIAGNOSIS — M545 Low back pain: Secondary | ICD-10-CM | POA: Diagnosis not present

## 2016-05-20 DIAGNOSIS — Z0000 Encounter for general adult medical examination without abnormal findings: Secondary | ICD-10-CM | POA: Insufficient documentation

## 2016-05-20 DIAGNOSIS — Z9109 Other allergy status, other than to drugs and biological substances: Secondary | ICD-10-CM | POA: Insufficient documentation

## 2016-05-20 DIAGNOSIS — Z91018 Allergy to other foods: Secondary | ICD-10-CM | POA: Diagnosis not present

## 2016-05-20 DIAGNOSIS — M419 Scoliosis, unspecified: Secondary | ICD-10-CM | POA: Diagnosis not present

## 2016-05-20 DIAGNOSIS — K029 Dental caries, unspecified: Secondary | ICD-10-CM | POA: Diagnosis not present

## 2016-05-20 DIAGNOSIS — T7800XA Anaphylactic reaction due to unspecified food, initial encounter: Secondary | ICD-10-CM | POA: Insufficient documentation

## 2016-05-20 DIAGNOSIS — Z933 Colostomy status: Secondary | ICD-10-CM | POA: Diagnosis not present

## 2016-05-20 DIAGNOSIS — B351 Tinea unguium: Secondary | ICD-10-CM | POA: Insufficient documentation

## 2016-05-20 DIAGNOSIS — Z91013 Allergy to seafood: Secondary | ICD-10-CM | POA: Insufficient documentation

## 2016-05-20 DIAGNOSIS — B353 Tinea pedis: Secondary | ICD-10-CM | POA: Diagnosis not present

## 2016-05-20 DIAGNOSIS — K089 Disorder of teeth and supporting structures, unspecified: Secondary | ICD-10-CM | POA: Insufficient documentation

## 2016-05-20 DIAGNOSIS — T7800XS Anaphylactic reaction due to unspecified food, sequela: Secondary | ICD-10-CM

## 2016-05-20 DIAGNOSIS — Z932 Ileostomy status: Secondary | ICD-10-CM

## 2016-05-20 DIAGNOSIS — Z114 Encounter for screening for human immunodeficiency virus [HIV]: Secondary | ICD-10-CM

## 2016-05-20 DIAGNOSIS — Q459 Congenital malformation of digestive system, unspecified: Secondary | ICD-10-CM

## 2016-05-20 LAB — CBC
HEMATOCRIT: 44.4 % (ref 38.5–50.0)
Hemoglobin: 13.6 g/dL (ref 13.2–17.1)
MCH: 24.6 pg — AB (ref 27.0–33.0)
MCHC: 30.6 g/dL — AB (ref 32.0–36.0)
MCV: 80.3 fL (ref 80.0–100.0)
MPV: 9.1 fL (ref 7.5–12.5)
PLATELETS: 309 10*3/uL (ref 140–400)
RBC: 5.53 MIL/uL (ref 4.20–5.80)
RDW: 14.7 % (ref 11.0–15.0)
WBC: 9.1 10*3/uL (ref 3.8–10.8)

## 2016-05-20 LAB — COMPLETE METABOLIC PANEL WITH GFR
ALT: 13 U/L (ref 9–46)
AST: 18 U/L (ref 10–40)
Albumin: 4.2 g/dL (ref 3.6–5.1)
Alkaline Phosphatase: 52 U/L (ref 40–115)
BUN: 5 mg/dL — ABNORMAL LOW (ref 7–25)
CALCIUM: 8.9 mg/dL (ref 8.6–10.3)
CHLORIDE: 108 mmol/L (ref 98–110)
CO2: 25 mmol/L (ref 20–31)
CREATININE: 1.05 mg/dL (ref 0.60–1.35)
GFR, EST NON AFRICAN AMERICAN: 89 mL/min (ref >=60)
Glucose, Bld: 76 mg/dL (ref 65–99)
POTASSIUM: 4.2 mmol/L (ref 3.5–5.3)
Sodium: 141 mmol/L (ref 135–146)
Total Bilirubin: 0.9 mg/dL (ref 0.2–1.2)
Total Protein: 6.5 g/dL (ref 6.1–8.1)

## 2016-05-20 MED ORDER — TERBINAFINE HCL 250 MG PO TABS: 250.0000 mg | ORAL_TABLET | Freq: Every day | ORAL | 2 refills | Status: DC

## 2016-05-20 MED ORDER — EPINEPHRINE 0.3 MG/0.3ML IJ SOAJ
0.3000 mg | Freq: Once | INTRAMUSCULAR | 2 refills | Status: DC | PRN
Start: 2016-05-20 — End: 2018-06-28

## 2016-05-20 MED FILL — TERBINAFINE HCL 250 MG TAB: 250 | 30 days supply | Qty: 30 | Fill #0

## 2016-05-20 NOTE — Patient Instructions (Addendum)
Rickey Fisher was seen today for annual exam.  Diagnoses and all orders for this visit:  Screening for HIV (human immunodeficiency virus) -     HIV antibody (with reflex)  Poor dentition -     Ambulatory referral to Dentistry  Onychomycosis of toenail -     CBC -     COMPLETE METABOLIC PANEL WITH GFR -     terbinafine (LAMISIL) 250 MG tablet; Take 1 tablet (250 mg total) by mouth daily.  Tinea pedis of both feet -     CBC -     COMPLETE METABOLIC PANEL WITH GFR -     terbinafine (LAMISIL) 250 MG tablet; Take 1 tablet (250 mg total) by mouth daily.  Anaphylactic shock due to food, sequela -     EPINEPHrine 0.3 mg/0.3 mL IJ SOAJ injection; Inject 0.3 mLs (0.3 mg total) into the muscle once as needed (anaphylaxis).  Colostomy in [facility]   You will be called with lab results  Your [facility]omy supplies [facility] come [facility]   F/u in 3 months for toenail fungus and tinea pedis   Dr. Armen PickupFunches

## 2016-05-20 NOTE — Progress Notes (Signed)
Needs refill on epi-pen.  Pt declined flu shot.

## 2016-05-20 NOTE — Progress Notes (Signed)
SUBJECTIVE:  Rickey Fisher is a 40 y.o. male presenting for his annual checkup and med/colostomy supply refills. He has not acute complaints. He has moderated lumbar back pain associated with scoliosis that is being treated at pain management.  He has been out of ileostomy supplies and has been using garbage bags instead.  When he has supplies he changes his ileostomy bag 2-3 times per day. 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   Past Medical History:  Diagnosis Date  . Cholelithiasis    CT abd 06/2010  . 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 Lumbar scoliosis 06/2010   noted on CT abdomen 06/2010. left convex lumbar     Current Outpatient Prescriptions  Medication Sig Dispense Refill  . acetaminophen-codeine (TYLENOL #3) 300-30 MG tablet Take 1 tablet by mouth every 4 (four) hours as needed for moderate pain. 30 tablet 0  . EPINEPHrine (EPI-PEN) 0.3 mg/0.3 mL DEVI Inject 0.3 mLs (0.3 mg total) into the muscle once. 1 Device 2  . ibuprofen (ADVIL,MOTRIN) 600 MG tablet Take 1 tablet (600 mg total) by mouth every 8 (eight) hours as needed. 60 tablet 0  . methocarbamol (ROBAXIN) 500 MG tablet Take 2 tablets (1,000 mg total) by mouth 3 (three) times daily. 120 tablet 1  . lidocaine (LIDODERM) 5 % Place 1 patch onto the skin daily. Remove & Discard patch within 12 hours or as directed by MD (Patient not taking: Reported on 05/20/2016) 30 patch 0   No current facility-administered medications for this visit.    Allergies: Banana; Shellfish allergy; Tomato; and Pollen extract   ROS:  Feeling well. No dyspnea or chest pain on exertion. No abdominal pain, change in bowel habits, black or bloody stools. No urinary tract or prostatic symptoms. No neurological complaints.  OBJECTIVE:  The patient appears well, alert, oriented x 3, in no distress.  BP 135/81 (BP  Location: Left Arm, Patient Position: Sitting, Cuff Size: Small)   Pulse 69   Temp 98.4 F (36.9 C) (Oral)   Ht 6\' 2"  (1.88 m)   Wt 141 lb (64 kg)   SpO2 98%   BMI 18.10 kg/m   Wt Readings from Last 3 Encounters:  05/20/16 141 lb (64 kg)  03/22/16 140 lb 6.4 oz (63.7 kg)  08/22/12 147 lb 12.8 oz (67 kg)   ENT very poor dentition.  Neck supple. No adenopathy or thyromegaly. PERLA. Lungs are clear, good air entry, no wheezes, rhonchi or rales. S1 and S2 normal, no murmurs, regular rate and rhythm. Abdomen is soft without tenderness, guarding, mass or organomegaly. Pink and patent ileostomy in RLQ.   Extremities show no edema, normal peripheral pulses thickened and browned toenails on both feet. Neurological is normal without focal findings.   ASSESSMENT:  healthy adult male Ileostomy  Onychomycosis Dental caries   PLAN:  Ileostomy supplies ordered through [facility] CMP and Lamisil for onychomycosis Dental referral placed

## 2016-05-20 NOTE — Therapy (Signed)
[facility] [address] [place], [place], [address] Phone: [phone]   Fax:  [phone]  Physical Therapy Evaluation  Patient Details  Name: Rickey Fisher MRN: [id number] Date of Birth: 09/28/1975 Referring Provider: Dr Jeanene ErbAndrew Kierstens  Encounter Date: 05/19/2016      PT End of Session - 05/20/16 1227    Visit Number 1   Number of Visits 1   Date for PT Re-Evaluation 05/19/16   Authorization Type Medicaid   PT Start Time 1415   PT Stop Time 1458   PT Time Calculation (min) 43 min   Activity Tolerance Patient tolerated treatment well   Behavior During Therapy [facility] for tasks assessed/performed      Past Medical History:  Diagnosis Date  . Cholelithiasis    CT abd 06/2010    Past Surgical History:  Procedure Laterality Date  . COLON SURGERY    . COLOSTOMY     As a baby, does not know why\    There were no vitals filed for this visit.       Subjective Assessment - 05/20/16 1214    Subjective Patient reports a long history of lower back pain but he has always been able to work through it. About three months ago he began having pain to the point were he can no tstand up straight. [facility] [employer] [facility] he has a significant scoliosis   Pertinent History colostomy bag since her was a baby    Limitations Walking;Standing   How long can you sit comfortably? < 30 min    How long can you stand comfortably? < 15 min    How long can you walk comfortably? < 15 min    Diagnostic tests Scoliosis Degeneration at L4-L5 L5- S1    Patient Stated Goals Less pain    Currently in Pain? Yes   Pain Score 8    Pain Location Back   Pain Orientation Lower   Pain Descriptors / Indicators Aching   Pain Type Surgical pain   Pain Onset More than a month ago   Pain Frequency Constant   Aggravating Factors  walking, standing, sitting    Pain Relieving Factors rest    Effect of Pain on Daily  Activities difficulty performing daily activity             [facility] - 05/20/16 0001      Assessment   Medical Diagnosis Low back pain    Referring Provider Dr Jeanene ErbAndrew Kierstens   Onset Date/Surgical Date --  "Back pain for several years but increased pain this year"    Hand Dominance Right   Next MD Visit 05/28/2016   Prior Therapy N     Precautions   Precautions None     Restrictions   Weight Bearing Restrictions No     Balance Screen   Has the patient fallen in the past 6 months No     Home Environment   Living Environment Unsure     Prior Function   Level of Independence Independent     Cognition   Overall Cognitive Status Within Functional Limits for tasks assessed   Memory Appears intact   Awareness Appears intact   Problem Solving Appears intact     Observation/Other Assessments   Observations Stands with flexed trunk and left hip elevation   Focus on Therapeutic Outcomes (FOTO)  Medicaid      Sensation   Light Touch Appears Intact  Coordination   Gross Motor Movements are Fluid and Coordinated No     ROM / Strength   AROM / PROM / Strength AROM;PROM;Strength     AROM   AROM Assessment Site Lumbar   Lumbar Flexion 75% with pain    Lumbar Extension 75% with pain    Lumbar - Right Side Bend 50% with pain    Lumbar - Left Side Bend 50% with pain    Lumbar - Right Rotation 25% with pain    Lumbar - Left Rotation 25% with pain      Strength   Strength Assessment Site Hip   Right/Left Hip Right;Left   Right Hip Flexion 4/5   Right Hip Extension 4/5   Right Hip ABduction 4/5   Right Hip ADduction 4/5   Left Hip Flexion 4/5   Left Hip Extension 4/5   Left Hip ABduction 4/5   Left Hip ADduction 4/5     Palpation   Palpation comment Significant spasming of the left lumbar paraspinal                    [facility] Adult PT Treatment/Exercise - 05/20/16 0001      Lumbar Exercises: Stretches   Active Hamstring Stretch  Limitations 90/90 1x20 sec    Lower Trunk Rotation Limitations x5    Piriformis Stretch Limitations 1x20sec      Lumbar Exercises: Standing   Other Standing Lumbar Exercises yellow band rows and extension with abdomianl breathing 2x10 ;     Other Standing Lumbar Exercises thoracic side bedn towards lumbar convexity      Lumbar Exercises: Supine   AB Set Limitations x5   Clam Limitations red x5    Bent Knee Raise Limitations x5 bilateral                 PT Education - 05/20/16 1224    Education provided Yes   Education Details HEP and symptom mangement    Person(s) Educated Patient   Methods Explanation;Demonstration;Verbal cues   Comprehension Verbalized understanding;Returned demonstration;Tactile cues required          PT Short Term Goals - 05/20/16 1231      PT SHORT TERM GOAL #1   Title Patient will be independent with basic HEP    Time 1   Period Days   Status Achieved                  Plan - 05/20/16 1228    Clinical Impression Statement Patient is a 40 year old male with significant lower back pain. he has difficulty standing up straight. He stands slowly. He has limitations with all lumbar movements. He has severe spasming lalong his left lumbar paraspinal. He would benefit from skilled therapy but will be seen 1x per medicaid guidlines. He was given an HEP for scoliosis/ hip stretching and for core stregthening.    PT Frequency 2x / week   PT Duration 8 weeks   PT Treatment/Interventions ADLs/Self Care Home Management;Functional mobility training;Patient/family education;Therapeutic activities;Therapeutic exercise   PT Next Visit Plan 1x visit    PT Home Exercise Plan See patient insturctions    Consulted and Agree with Plan of Care Patient      Patient will benefit from skilled therapeutic intervention in order to improve the following deficits and impairments:  Pain, Decreased strength, Decreased mobility, Increased muscle spasms, Decreased  safety awareness, Decreased endurance, Difficulty walking, Decreased range of motion, Decreased coordination  Visit Diagnosis: Acute bilateral  low back pain without sciatica  Muscle spasm of back     Problem List Patient Active Problem List   Diagnosis Date Noted  . Lumbar spine scoliosis 05/07/2016  . Cough productive of purulent sputum 08/22/2012  . Colostomy in place [facility]) 04/07/2012    Dessie Coma PT DPT  05/20/2016, 3:11 PM  [facility] [address] [place], [place], [address] Phone: [phone]   Fax:  [phone]  Name: Rickey Fisher MRN: [id number] Date of Birth: [birth year]

## 2016-05-21 LAB — HIV ANTIBODY (ROUTINE TESTING W REFLEX): HIV: NONREACTIVE

## 2016-05-25 ENCOUNTER — Telehealth: Payer: Self-pay

## 2016-05-25 NOTE — Telephone Encounter (Signed)
Pt was called on 10/17 and informed of his lab results.

## 2016-05-28 ENCOUNTER — Encounter: Payer: Medicaid Other | Attending: Physical Medicine & Rehabilitation

## 2016-05-28 ENCOUNTER — Encounter: Payer: Self-pay | Admitting: Physical Medicine & Rehabilitation

## 2016-05-28 ENCOUNTER — Ambulatory Visit (HOSPITAL_BASED_OUTPATIENT_CLINIC_OR_DEPARTMENT_OTHER): Payer: Medicaid Other | Admitting: Physical Medicine & Rehabilitation

## 2016-05-28 VITALS — BP 115/80 | HR 74 | Resp 14

## 2016-05-28 DIAGNOSIS — M5442 Lumbago with sciatica, left side: Secondary | ICD-10-CM | POA: Diagnosis not present

## 2016-05-28 DIAGNOSIS — M419 Scoliosis, unspecified: Secondary | ICD-10-CM | POA: Diagnosis not present

## 2016-05-28 DIAGNOSIS — Z8249 Family history of ischemic heart disease and other diseases of the circulatory system: Secondary | ICD-10-CM | POA: Diagnosis not present

## 2016-05-28 DIAGNOSIS — G8929 Other chronic pain: Secondary | ICD-10-CM

## 2016-05-28 DIAGNOSIS — F17210 Nicotine dependence, cigarettes, uncomplicated: Secondary | ICD-10-CM | POA: Insufficient documentation

## 2016-05-28 DIAGNOSIS — G894 Chronic pain syndrome: Secondary | ICD-10-CM | POA: Insufficient documentation

## 2016-05-28 DIAGNOSIS — Z8719 Personal history of other diseases of the digestive system: Secondary | ICD-10-CM | POA: Diagnosis not present

## 2016-05-28 DIAGNOSIS — M41126 Adolescent idiopathic scoliosis, lumbar region: Secondary | ICD-10-CM

## 2016-05-28 DIAGNOSIS — M5441 Lumbago with sciatica, right side: Secondary | ICD-10-CM | POA: Diagnosis not present

## 2016-05-28 DIAGNOSIS — Z79899 Other long term (current) drug therapy: Secondary | ICD-10-CM | POA: Diagnosis not present

## 2016-05-28 DIAGNOSIS — Z5181 Encounter for therapeutic drug level monitoring: Secondary | ICD-10-CM | POA: Diagnosis not present

## 2016-05-28 MED ORDER — ACETAMINOPHEN-CODEINE #3 300-30 MG PO TABS: 1.0000 | ORAL_TABLET | Freq: Two times a day (BID) | ORAL | 0 refills | Status: DC

## 2016-05-28 MED ORDER — CYCLOBENZAPRINE HCL 10 MG PO TABS: 10.0000 mg | ORAL_TABLET | Freq: Every day | ORAL | 0 refills | Status: DC

## 2016-05-28 MED FILL — CYCLOBENZAPRINE 10 MG TAB: 10 | 30 days supply | Qty: 30 | Fill #0

## 2016-05-28 MED FILL — ACETAMINOPHEN/COD #3 TABLET: 300-30 | 30 days supply | Qty: 60 | Fill #0

## 2016-05-28 NOTE — Patient Instructions (Addendum)
Camomile tea rather than coffee at night   Please take 1 Tylenol with Codeine and 1 muscle relaxer at night before you go to sleep   Take 1 Tylenol with Codeine in the morning when you get up.

## 2016-05-28 NOTE — Progress Notes (Signed)
Subjective:    Patient ID: Rickey Fisher, male    DOB: 12-16-1975, 40 y.o.   MRN: [id number]   40 year old male with history of chronic back pain. He's had some pain in the left lower extremity in the knee area. He has undergone physical therapy evaluation and received stretching and strengthening program, which I have reviewed with the patient. He is able to do most the exercises except for the ones that cause him to flex his hips. 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 Patient has left knee pain. Sometimes it feels like his left knee will go out on him. He does not have any swelling in the left knee. No numbness or tingling.  Pain Inventory Average Pain 10 Pain Right Now 9 My pain is sharp  In the last 24 hours, has pain interfered with the following? General activity 10 Relation with others 10 Enjoyment of life 10 What TIME of day is your pain at its worst? morning, evening, night Sleep (in general) Poor  Pain is worse with: walking, bending, sitting and standing Pain improves with: rest, heat/ice and medication Relief from Meds: 8  Mobility walk without assistance ability to climb steps?  no do you drive?  no Do you have any goals in this area?  yes  Function not employed: date last employed 2017 I need assistance with the following:  meal prep and household duties  Neuro/Psych weakness depression  Prior Studies Any changes since last visit?  no CLINICAL DATA:  Back pain for 3 days.  EXAM: LUMBAR SPINE - COMPLETE 4+ VIEW  COMPARISON:  CT scan of the abdomen and pelvis dated 07/03/2010  FINDINGS: There is a chronic thoracolumbar scoliosis, unchanged since the prior CT scan. Degenerative disc disease at L4-5. Slight facet arthritis at L3-4 L4-5. No fracture or subluxation.  IMPRESSION: Chronic changes of the lumbar spine, stable. Stable thoracolumbar scoliosis.  Physicians involved in your care Any changes since last visit?  no   Family  History  Problem Relation Age of Onset  . Lupus Father   . Hypertension Mother    Social History   Social History  . Marital status: Single    Spouse name: N/A  . Number of children: 0  . Years of education: GED   Occupational History  .  Not Employed   Social History Main Topics  . Smoking status: Current Every Day Smoker    Packs/day: 0.10    Types: Cigarettes  . Smokeless tobacco: Never Used  . Alcohol use No  . Drug use:     Types: Marijuana  . Sexual activity: No   Other Topics Concern  . None   Social History Narrative   Worked at [employer]; Grew up in [place]; Went to [employer], dropped out in 11th grade,but acquired GED   Lives with friends/family [place] on [place]"   Has 3 brothers, 1 sister - all in [place]   Mom & Dad in [place] as well.   Past Surgical History:  Procedure Laterality Date  . COLON SURGERY    . COLOSTOMY     As a baby, does not know why\   Past Medical History:  Diagnosis Date  . Cholelithiasis    CT abd 06/2010  . 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 Lumbar scoliosis 06/2010   noted on CT abdomen 06/2010. left convex lumbar    BP 115/80   Pulse 74   Resp 14   SpO2 98%  Opioid Risk Score:   Fall Risk Score:  `1  Depression screen PHQ 2/9  [facility] 05/20/2016 05/07/2016 03/22/2016 08/22/2012 04/07/2012  Decreased Interest 3 3 3  0 1  Down, Depressed, Hopeless 2 3 2  0 1  PHQ - 2 Score 5 6 5  0 2  Altered sleeping 3 3 3  - -  Tired, decreased energy 3 3 1  - -  Change in appetite 3 3 - - -  Feeling bad or failure about yourself  2 3 2  - -  Trouble concentrating 2 3 0 - -  Moving slowly or fidgety/restless 3 3 0 - -  Suicidal thoughts 2 1 0 - -  PHQ-9 Score 23 25 11  - -  Difficult doing work/chores - Extremely dIfficult Somewhat difficult - -   HPI    Review of Systems  Constitutional: Positive for unexpected weight change.  All other systems reviewed and are negative.        Objective:   Physical Exam Posture is forward flexed at the hips and leaning toward the left side. He is unable to straighten out.  Sensation intact to light touch as well as pinprick in bilateral lower extremities. Negative straight leg raising Is mildly diminished right internal rotation of the hip, external rotation intact. Left sided hip range of motion is normal. Motor strength is 4/5 bilateral hip flexors, 5 in the knee extensors, 5 at the ankle dorsiflexors. Deep tendon reflexes 2 plus bilateral lower extremities    Assessment & Plan:  1. Lumbar scoliosis centered at L3, levoconvex. Rotatory component as well He does have left lower extremity discomfort, but normal. Knee examination. Given his x-ray findings, plus clinical findings to may have an L3 radiculopathy, would recommend MRI lumbar spine  Would consider epidural L3-L4. If corroborating MRI findings, for his axial pain, he may benefit from medial branch blocks.  Discussed with patient as well as his aunt agree with plan. Follow-up in 2 weeks

## 2016-06-11 MED FILL — EPINEPHRINE 0.3 MG AUTO-INJ: 0.3 | 30 days supply | Qty: 2 | Fill #0

## 2016-06-14 ENCOUNTER — Encounter: Payer: Medicaid Other | Attending: Physical Medicine & Rehabilitation

## 2016-06-14 ENCOUNTER — Encounter: Payer: Self-pay | Admitting: Physical Medicine & Rehabilitation

## 2016-06-14 ENCOUNTER — Encounter (INDEPENDENT_AMBULATORY_CARE_PROVIDER_SITE_OTHER): Payer: Self-pay

## 2016-06-14 ENCOUNTER — Ambulatory Visit (HOSPITAL_BASED_OUTPATIENT_CLINIC_OR_DEPARTMENT_OTHER): Payer: Medicaid Other | Admitting: Physical Medicine & Rehabilitation

## 2016-06-14 VITALS — BP 142/80 | HR 73 | Resp 14

## 2016-06-14 DIAGNOSIS — M5441 Lumbago with sciatica, right side: Secondary | ICD-10-CM | POA: Diagnosis not present

## 2016-06-14 DIAGNOSIS — G894 Chronic pain syndrome: Secondary | ICD-10-CM | POA: Insufficient documentation

## 2016-06-14 DIAGNOSIS — Z8719 Personal history of other diseases of the digestive system: Secondary | ICD-10-CM | POA: Insufficient documentation

## 2016-06-14 DIAGNOSIS — F17210 Nicotine dependence, cigarettes, uncomplicated: Secondary | ICD-10-CM | POA: Diagnosis not present

## 2016-06-14 DIAGNOSIS — Z5181 Encounter for therapeutic drug level monitoring: Secondary | ICD-10-CM | POA: Insufficient documentation

## 2016-06-14 DIAGNOSIS — Z8249 Family history of ischemic heart disease and other diseases of the circulatory system: Secondary | ICD-10-CM | POA: Insufficient documentation

## 2016-06-14 DIAGNOSIS — Z79899 Other long term (current) drug therapy: Secondary | ICD-10-CM | POA: Diagnosis not present

## 2016-06-14 DIAGNOSIS — M419 Scoliosis, unspecified: Secondary | ICD-10-CM | POA: Insufficient documentation

## 2016-06-14 DIAGNOSIS — M47816 Spondylosis without myelopathy or radiculopathy, lumbar region: Secondary | ICD-10-CM

## 2016-06-14 NOTE — Progress Notes (Signed)
  PROCEDURE RECORD [facility]   Name: Rickey Fisher DOB:03/27/1976 MRN: [id number]  Date:06/14/2016  Physician: Claudette LawsAndrew Kirsteins, MD    Nurse/CMA: Bing PlumeHaynes  Allergies:  Allergies  Allergen Reactions  . Banana Anaphylaxis  . Shellfish Allergy Anaphylaxis  . Tomato Anaphylaxis  . Pollen Extract Itching    Consent Signed: Yes.    Is patient diabetic? No.  CBG today?   Pregnant LMP: No LMP for male patient. (age 39-55)  Anticoagulants: no Anti-inflammatory: no Antibiotics: no  Procedure: Left Medial Branch Block Position: Prone Start Time: 1352 End Time: 1357 Fluoro Time: 17  RN/CMA Rubie Ficco     Time 1311 1404    BP 142/80 122/81    Pulse 73 72    Respirations 14 14    O2 Sat 98 99    S/S 6 6    Pain Level 7 4     D/C home with Rickey Fisher, patient A & O X 3, D/C instructions reviewed, and sits independently.

## 2016-06-14 NOTE — Patient Instructions (Signed)

## 2016-06-14 NOTE — Progress Notes (Signed)
Left Lumbar L3, L4  medial branch blocks and L 5 dorsal ramus injection under fluoroscopic guidance   Indication: Left Lumbar pain which is not relieved by medication management or other conservative care and interfering with self-care and mobility.  Informed consent was obtained after describing risks and benefits of the procedure with the patient, this includes bleeding, bruising, infection, paralysis and medication side effects.  The patient wishes to proceed and has given written consent.  The patient was placed in a prone position.  The lumbar area was marked and prepped with Betadine.  One mL of 1% lidocaine was injected into each of 3 areas into the skin and subcutaneous tissue.  Then a 22-gauge 3.5 in spinal needle was inserted targeting the junction of the left S1 superior articular process and sacral ala junction.  Needle was advanced under fluoroscopic guidance.  Bone contact was made.  Omnipaque 180 was injected x 0.5 mL demonstrating no intravascular uptake.  Then a solution containing one mL of 4 mg per mL dexamethasone and 3 mL of 2% MPF lidocaine was injected x 0.5 mL.  Then the left L5 superior articular process in transverse process junction was targeted.  Bone contact was made.  Omnipaque 180 was injected x 0.5 mL demonstrating no intravascular uptake.  Then a solution containing one mL of 4 mg per mL dexamethasone and 3 mL of 2% MPF lidocaine was injected x 0.5 mL.  Then the left L4 superior articular process in transverse process junction was targeted.  Bone contact was made.  Omnipaque 180 was injected x 0.5 mL demonstrating no intravascular uptake.  Then a solution containing one mL of 4 mg per mL dexamethasone and 3 mL of 2% MPF lidocaine was injected x 0.5 mL.  Patient tolerated procedure well.  Post procedure instructions were given.  

## 2016-07-13 ENCOUNTER — Encounter: Payer: Self-pay | Admitting: Physical Medicine & Rehabilitation

## 2016-07-13 ENCOUNTER — Encounter: Payer: Medicaid Other | Attending: Physical Medicine & Rehabilitation

## 2016-07-13 ENCOUNTER — Ambulatory Visit (HOSPITAL_BASED_OUTPATIENT_CLINIC_OR_DEPARTMENT_OTHER): Payer: Medicaid Other | Admitting: Physical Medicine & Rehabilitation

## 2016-07-13 VITALS — BP 131/91 | HR 75 | Resp 14

## 2016-07-13 DIAGNOSIS — Z8249 Family history of ischemic heart disease and other diseases of the circulatory system: Secondary | ICD-10-CM | POA: Diagnosis not present

## 2016-07-13 DIAGNOSIS — M5441 Lumbago with sciatica, right side: Secondary | ICD-10-CM | POA: Diagnosis not present

## 2016-07-13 DIAGNOSIS — Z79899 Other long term (current) drug therapy: Secondary | ICD-10-CM | POA: Diagnosis not present

## 2016-07-13 DIAGNOSIS — M419 Scoliosis, unspecified: Secondary | ICD-10-CM | POA: Diagnosis not present

## 2016-07-13 DIAGNOSIS — Z8719 Personal history of other diseases of the digestive system: Secondary | ICD-10-CM | POA: Diagnosis not present

## 2016-07-13 DIAGNOSIS — Z5181 Encounter for therapeutic drug level monitoring: Secondary | ICD-10-CM | POA: Insufficient documentation

## 2016-07-13 DIAGNOSIS — G894 Chronic pain syndrome: Secondary | ICD-10-CM | POA: Insufficient documentation

## 2016-07-13 DIAGNOSIS — F17210 Nicotine dependence, cigarettes, uncomplicated: Secondary | ICD-10-CM | POA: Insufficient documentation

## 2016-07-13 DIAGNOSIS — M4125 Other idiopathic scoliosis, thoracolumbar region: Secondary | ICD-10-CM | POA: Diagnosis not present

## 2016-07-13 MED ORDER — CYCLOBENZAPRINE HCL 10 MG PO TABS: 10.0000 mg | ORAL_TABLET | Freq: Every day | ORAL | 1 refills | Status: DC

## 2016-07-13 MED ORDER — ACETAMINOPHEN-CODEINE #3 300-30 MG PO TABS: 1.0000 | ORAL_TABLET | Freq: Two times a day (BID) | ORAL | 1 refills | Status: DC

## 2016-07-13 MED FILL — ACETAMINOPHEN/COD #3 TABLET: 300-30 | 30 days supply | Qty: 60 | Fill #0

## 2016-07-13 NOTE — Progress Notes (Signed)
Subjective:    Patient ID: Rickey SextonCharles Fisher, male    DOB: 03/01/1976, 40 y.o.   MRN: [id number]  HPI Pt doesn't recall onset of spine curvature, his father has "rods in his back" at age 39.   Have issues getting MRI done, has to get auth, they require 6 wks of MD supervision before it gets authorized.  Patient did get approximately 50% relief of low back pain after lumbar medial branch blocks performed 06/14/2016. Results only lasted about 2 days. He would consider repeat, we did discuss radiofrequency neurotomy as a longer term treatment option, but this would not correct his spinal deformity.  [facility]. Patient feels like he is walking more bent forward.  We discussed that his spinal deformity could alter his posture as well as create muscle imbalance. No numbness in feet, some knee weakness on left side.  Pain Inventory Average Pain 9 Pain Right Now 9 My pain is sharp and aching  In the last 24 hours, has pain interfered with the following? General activity 0 Relation with others 0 Enjoyment of life 0 What TIME of day is your pain at its worst? all Sleep (in general) Fair  Pain is worse with: walking, bending, sitting and standing Pain improves with: rest and medication Relief from Meds: 7  Mobility do you drive?  no  Function not employed: date last employed . I need assistance with the following:  household duties  Neuro/Psych trouble walking confusion depression  Prior Studies Any changes since last visit?  no  Physicians involved in your care Any changes since last visit?  no   Family History  Problem Relation Age of Onset  . Lupus Father   . Hypertension Mother    Social History   Social History  . Marital status: Single    Spouse name: N/A  . Number of children: 0  . Years of education: GED   Occupational History  .  Not Employed   Social History Main Topics  . Smoking status: Current Every Day Smoker      Packs/day: 0.10    Types: Cigarettes  . Smokeless tobacco: Never Used  . Alcohol use No  . Drug use:     Types: Marijuana  . Sexual activity: No   Other Topics Concern  . None   Social History Narrative   Worked at [employer]; [place] up [place]; Went to [employer], dropped out in 11th grade,but acquired GED   Lives with friends/family off on on [place]"   Has 3 brothers, 1 sister - all in [place]   Mom & Dad in [place] as well.   Past Surgical History:  Procedure Laterality Date  . COLON SURGERY    . COLOSTOMY     As a baby, does not know why\   Past Medical History:  Diagnosis Date  . Cholelithiasis    CT abd 06/2010  . 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 Lumbar scoliosis 06/2010   noted on CT abdomen 06/2010. left convex lumbar    BP (!) 131/91   Pulse 75   Resp 14   SpO2 98%   Opioid Risk Score:   Fall Risk Score:  `1  Depression screen PHQ 2/9  [facility] 05/20/2016 05/07/2016 03/22/2016 08/22/2012 04/07/2012  Decreased Interest 3 3 3  0 1  Down, Depressed, Hopeless 2 3 2  0 1  PHQ - 2 Score 5 6 5  0 2  Altered sleeping 3 3 3  - -  Tired, decreased energy 3 3 1  - -  Change in appetite 3 3 - - -  Feeling bad or failure about yourself  2 3 2  - -  Trouble concentrating 2 3 0 - -  Moving slowly or fidgety/restless 3 3 0 - -  Suicidal thoughts 2 1 0 - -  PHQ-9 Score 23 25 11  - -  Difficult doing work/chores - Extremely dIfficult Somewhat difficult - -    Review of Systems  Constitutional: Positive for appetite change and unexpected weight change.  HENT: Negative.   Eyes: Negative.   Respiratory: Negative.   Cardiovascular: Negative.   Gastrointestinal: Negative.   Endocrine: Negative.   Genitourinary: Negative.   Musculoskeletal: Negative.   Skin: Negative.   Allergic/Immunologic: Negative.   Neurological: Negative.   Hematological: Negative.   Psychiatric/Behavioral: Positive for dysphoric mood.  All other  systems reviewed and are negative.      Objective:   Physical Exam  Constitutional: He is oriented to person, place, and time. He appears well-developed and well-nourished.  HENT:  Head: Normocephalic and atraumatic.  Eyes: Conjunctivae and EOM are normal. Pupils are equal, round, and reactive to light.  Neurological: He is alert and oriented to person, place, and time.  Skin: Skin is warm and dry.  Nursing note and vitals reviewed.  Normal strength 5/5 in HF, KE and ADF Sensation intact to light touch as well as pinprick in bilateral lower extremities. Negative straight leg raising Is mildly diminished right internal rotation of the hip, external rotation intact. Left sided hip range of motion is normal. Motor strength is 4/5 bilateral hip flexors, 5 in the knee extensors, 5 at the ankle dorsiflexors. Deep tendon reflexes 2 plus bilateral lower extremities Forward flexed posture at the hips, difficulty with straightening out. He is able to ambulate without evidence to drag or knee instability. He does not require an assistive device. Levoconvex scoliosis, lumbar    Assessment & Plan:  1. Primarily lumbar scoliosis, on x-ray centered at L 3, some compensatory thoracic curve to the right, We discussed pain versus spinal deformity. While he may get some pain relief with spinal injections that address the facet joints, it will not address his spinal deformity. [facility], but he would like to try to get in appointment in this area sooner if possible. Referral to [facility], Dr. Shon BatonBrooks

## 2016-07-13 NOTE — Patient Instructions (Addendum)
Referral to Dr. Wonda HornerBrooks [facility]  [facility]. In the meantime, we'll schedule for medial branch blocks and try to get that MRI approved

## 2016-07-14 ENCOUNTER — Telehealth: Payer: Self-pay | Admitting: Physical Medicine & Rehabilitation

## 2016-07-14 DIAGNOSIS — G8929 Other chronic pain: Secondary | ICD-10-CM

## 2016-07-14 DIAGNOSIS — M5442 Lumbago with sciatica, left side: Principal | ICD-10-CM

## 2016-07-14 DIAGNOSIS — M41126 Adolescent idiopathic scoliosis, lumbar region: Secondary | ICD-10-CM

## 2016-07-19 ENCOUNTER — Telehealth: Payer: Self-pay | Admitting: Physical Medicine & Rehabilitation

## 2016-07-19 NOTE — Telephone Encounter (Signed)
[name] Atty sent req for disability forms [place] reviewed and states need referral for PT.  [name] put in req for eval - called and left voicemail for atty that we cannot complete but have done referral.

## 2016-07-19 NOTE — Telephone Encounter (Signed)
Orders placed for [facility] per Dr Wynn BankerKirsteins

## 2016-07-29 NOTE — Telephone Encounter (Signed)
Dr. Arnetha CourserAK reviewed req for disability forms - more specific needs PT Eval.SS put in referral, called atty johnson left voicemail advising we cannot complete but referred

## 2016-08-10 ENCOUNTER — Ambulatory Visit (HOSPITAL_BASED_OUTPATIENT_CLINIC_OR_DEPARTMENT_OTHER): Payer: Medicaid Other | Admitting: Physical Medicine & Rehabilitation

## 2016-08-10 ENCOUNTER — Encounter: Payer: Self-pay | Admitting: Physical Medicine & Rehabilitation

## 2016-08-10 ENCOUNTER — Encounter: Payer: Medicaid Other | Attending: Physical Medicine & Rehabilitation

## 2016-08-10 VITALS — BP 133/84 | HR 81

## 2016-08-10 DIAGNOSIS — F17210 Nicotine dependence, cigarettes, uncomplicated: Secondary | ICD-10-CM | POA: Diagnosis not present

## 2016-08-10 DIAGNOSIS — Z5181 Encounter for therapeutic drug level monitoring: Secondary | ICD-10-CM

## 2016-08-10 DIAGNOSIS — Z8249 Family history of ischemic heart disease and other diseases of the circulatory system: Secondary | ICD-10-CM | POA: Insufficient documentation

## 2016-08-10 DIAGNOSIS — Z8719 Personal history of other diseases of the digestive system: Secondary | ICD-10-CM | POA: Insufficient documentation

## 2016-08-10 DIAGNOSIS — Z79899 Other long term (current) drug therapy: Secondary | ICD-10-CM | POA: Insufficient documentation

## 2016-08-10 DIAGNOSIS — G894 Chronic pain syndrome: Secondary | ICD-10-CM | POA: Insufficient documentation

## 2016-08-10 DIAGNOSIS — M419 Scoliosis, unspecified: Secondary | ICD-10-CM

## 2016-08-10 DIAGNOSIS — M5441 Lumbago with sciatica, right side: Secondary | ICD-10-CM | POA: Diagnosis not present

## 2016-08-10 NOTE — Patient Instructions (Signed)

## 2016-08-10 NOTE — Progress Notes (Signed)
  PROCEDURE RECORD [facility]   Name: Rayna SextonCharles Tolosa DOB:10/15/1975 MRN: [id number]  Date:08/10/2016  Physician: Claudette LawsAndrew Kirsteins, MD    Nurse/CMA: Chayce Rullo CMA  Allergies:  Allergies  Allergen Reactions  . Banana Anaphylaxis  . Shellfish Allergy Anaphylaxis  . Tomato Anaphylaxis  . Pollen Extract Itching    Consent Signed: Yes.    Is patient diabetic? No.  CBG today?   Pregnant: No. LMP: No LMP for male patient. (age 40-55)  Anticoagulants: no Anti-inflammatory: no Antibiotics: no  Procedure: left medial branch block under floroscopic Position: Prone Start Time:218 End Time:223  Fluoro Time:14s  RN/CMA Keyarah Mcroy CMA Lynlee Stratton CMA    Time 200 230    BP 133/84 134/94    Pulse 81 77    Respirations 16 16    O2 Sat 98 98    S/S 6 6    Pain Level 8 6     D/C home with Father, patient A & O X 3, D/C instructions reviewed, and sits independently.

## 2016-08-10 NOTE — Addendum Note (Signed)
Addended by: Barbee ShropshireBRIGHT, Dona Klemann B on: 08/10/2016 02:53 PM   Modules accepted: Orders

## 2016-08-18 ENCOUNTER — Telehealth: Payer: Self-pay

## 2016-08-18 LAB — TOXASSURE SELECT,+ANTIDEPR,UR

## 2016-08-18 NOTE — Progress Notes (Signed)
Patient Urine drug screen came back Inconsistent. Urine sample contained low levels of Carboxy-THC, which indicates the presences of marijuana. Please See lab report for details and advise. 

## 2016-08-18 NOTE — Telephone Encounter (Signed)
Patient Urine drug screen came back Inconsistent. Urine sample contained low levels of Carboxy-THC, which indicates the presences of marijuana. Please See lab report for details and advise.

## 2016-08-19 MED ORDER — TRAMADOL HCL 50 MG PO TABS: 50.0000 mg | ORAL_TABLET | Freq: Three times a day (TID) | ORAL | 0 refills | Status: DC | PRN

## 2016-08-19 NOTE — Telephone Encounter (Signed)
Sorry Nurse, children'sdragon error. Discontinue Tylenol with codeine

## 2016-08-19 NOTE — Telephone Encounter (Signed)
Clarification : did you want to continue tyl #3?

## 2016-08-19 NOTE — Telephone Encounter (Signed)
Tylenol #3 refill discontinued @ [facility] and verbal for tramadol called in. Attempted to reach MR Pearman.  No answer and no voicemail.

## 2016-08-19 NOTE — Telephone Encounter (Signed)
Continue Tylenol with codeine Start tramadol 50 mg 3 times a day Notify patient of positive UDS for THC

## 2016-08-20 NOTE — Telephone Encounter (Signed)
I tried calling patient again. Phone kept ringing, no answer, no voicemail.

## 2016-09-07 ENCOUNTER — Ambulatory Visit (INDEPENDENT_AMBULATORY_CARE_PROVIDER_SITE_OTHER): Payer: Medicaid Other | Admitting: Orthopaedic Surgery

## 2016-09-07 ENCOUNTER — Encounter (INDEPENDENT_AMBULATORY_CARE_PROVIDER_SITE_OTHER): Payer: Self-pay | Admitting: Orthopaedic Surgery

## 2016-09-07 ENCOUNTER — Ambulatory Visit (HOSPITAL_BASED_OUTPATIENT_CLINIC_OR_DEPARTMENT_OTHER): Payer: Medicaid Other | Admitting: Physical Medicine & Rehabilitation

## 2016-09-07 ENCOUNTER — Encounter: Payer: Self-pay | Admitting: Physical Medicine & Rehabilitation

## 2016-09-07 VITALS — BP 116/77 | HR 77 | Resp 14

## 2016-09-07 VITALS — BP 130/81 | HR 80 | Ht 74.5 in | Wt 150.0 lb

## 2016-09-07 DIAGNOSIS — M41126 Adolescent idiopathic scoliosis, lumbar region: Secondary | ICD-10-CM

## 2016-09-07 DIAGNOSIS — M4156 Other secondary scoliosis, lumbar region: Secondary | ICD-10-CM

## 2016-09-07 DIAGNOSIS — M5416 Radiculopathy, lumbar region: Secondary | ICD-10-CM | POA: Diagnosis not present

## 2016-09-07 DIAGNOSIS — Z932 Ileostomy status: Secondary | ICD-10-CM | POA: Diagnosis not present

## 2016-09-07 DIAGNOSIS — G894 Chronic pain syndrome: Secondary | ICD-10-CM | POA: Diagnosis not present

## 2016-09-07 NOTE — Progress Notes (Signed)
Subjective:    Patient ID: Rickey Fisher, male    DOB: [birth year], 41 y.o.   MRN: [id number]  HPI Follow-up from left sided medial branch blocks L3, L4 and L5 dorsal ramus performed on 08/10/2016. Patient had less than 50% relief for a couple weeks. Pain is back to his baseline. He has followed up with orthopedic spine surgeon, Dr. Ophelia Charter. I reviewed the note. Dr. Ophelia Charter is ordering an MRI of the lumbar spine. Prior request from my office for. MRI was denied by insurance. Patient denies any pain shooting down his legs. No loss of bladder function. He has a chronic colostomy so Bowel function cannot be assessed Pain Inventory Average Pain 9 Pain Right Now 9 My pain is constant, sharp, stabbing and aching  In the last 24 hours, has pain interfered with the following? General activity 0 Relation with others 0 Enjoyment of life 0 What TIME of day is your pain at its worst? no selection Sleep (in general) no selection  Pain is worse with: walking, bending, sitting, inactivity and standing Pain improves with: rest, medication and injections Relief from Meds: 5  Mobility walk without assistance do you drive?  no Do you have any goals in this area?  yes  Function not employed: date last employed . disabled: date disabled . Do you have any goals in this area?  yes  Neuro/Psych weakness numbness trouble walking depression  Prior Studies Any changes since last visit?  no  Physicians involved in your care Any changes since last visit?  no   Family History  Problem Relation Age of Onset  . Lupus Father   . Hypertension Mother    Social History   Social History  . Marital status: Single    Spouse name: N/A  . Number of children: 0  . Years of education: GED   Occupational History  .  Not Employed   Social History Main Topics  . Smoking status: Current Every Day Smoker    Packs/day: 0.10    Types: Cigarettes  . Smokeless tobacco: Never Used  . Alcohol use No  .  Drug use: Yes    Types: Marijuana  . Sexual activity: No   Other Topics Concern  . None   Social History Narrative   Worked at [employer]; Grew up in [place]; Went to [employer], dropped out in 11th grade,but acquired GED   Lives with friends/family [place]"   Has 3 brothers, 1 sister - all in [place]   Mom & Dad in [place] as well.   Past Surgical History:  Procedure Laterality Date  . COLON SURGERY    . COLOSTOMY     As a baby, does not know why\   Past Medical History:  Diagnosis Date  . Cholelithiasis    CT abd 06/2010  . Congenital anomaly of intestine [birth year]  . Lumbar scoliosis 06/2010   noted on CT abdomen 06/2010. left convex lumbar    BP 116/77   Pulse 77   Resp 14   SpO2 97%   Opioid Risk Score:   Fall Risk Score:  `1  Depression screen PHQ 2/9  Depression screen [facility] 2/9 05/20/2016 05/07/2016 03/22/2016 08/22/2012 04/07/2012  Decreased Interest 3 3 3  0 1  Down, Depressed, Hopeless 2 3 2  0 1  PHQ - 2 Score 5 6 5  0 2  Altered sleeping 3 3 3  - -  Tired, decreased energy 3 3 1  - -  Change in  appetite 3 3 - - -  Feeling bad or failure about yourself  2 3 2  - -  Trouble concentrating 2 3 0 - -  Moving slowly or fidgety/restless 3 3 0 - -  Suicidal thoughts 2 1 0 - -  PHQ-9 Score 23 25 11  - -  Difficult doing work/chores - Extremely dIfficult Somewhat difficult - -     Review of Systems  Constitutional: Positive for unexpected weight change.  HENT: Negative.   Eyes: Negative.   Respiratory: Negative.   Cardiovascular: Negative.   Gastrointestinal: Negative.   Endocrine: Negative.   Genitourinary: Negative.   Musculoskeletal: Positive for back pain and gait problem.  Skin: Negative.   Allergic/Immunologic: Negative.   Neurological: Positive for weakness and numbness.  Hematological: Negative.   Psychiatric/Behavioral: Positive for dysphoric mood.  All other systems reviewed and are negative.      Objective:    Physical Exam  Constitutional: He is oriented to person, place, and time. He appears well-developed and well-nourished.  HENT:  Head: Normocephalic and atraumatic.  Neurological: He is alert and oriented to person, place, and time.  Psychiatric: He has a normal mood and affect. His behavior is normal. Judgment and thought content normal.  Nursing note and vitals reviewed. Levoconvex scoliosis entered at L3 with compensatory right thoracic scoliosis Has muscle spasm on the left side of the lumbar area. No tenderness or spasm noted in the right thoracic lumbar area. Negative straight leg raising. Motor strength is 5/5 bilateral hip flexor, knee extensor, ankle dorsal flexor. Sensation reduced in the left L4, L5, S1 dermatomal distribution to pinprick. Deep tendon reflexes are reduced. Bilateral patellar and bilateral Achilles       Assessment & Plan:  1. Lumbar scoliosis, unsure whether this is the etiology of his low back pain. He does have left foot numbness, which I suspect is coming from his back. Appreciate orthopedic note. Hopefully, the lumbar MRI can be approved soon. In the meantime, we will set up for left S1 transforaminal injection Pain medicine as per primary care, THC detected urine drug screen here at this office

## 2016-09-07 NOTE — Progress Notes (Signed)
Office Visit Note/orthopedic consult Requesting: Dr. Wynn BankerKirsteins   Patient: Rickey Fisher           Date of Birth: 04/26/1976           MRN: [id number] Visit Date: 09/07/2016              Requested by: Erick ColaceAndrew E Kirsteins, MD [address] [place], [place] [address] PCP: Lora PaulaFUNCHES, JOSALYN C, MD   Assessment & Plan: Visit Diagnoses:  1. Other secondary scoliosis, lumbar region     Plan: Patient's been through physical therapy he said the pain management lumbar lumbar epidural steroid injections 2 in the past. He needs to have an MRI scan make sure he does not have a compressive lesion. He can't stand up straight and has not been able to get correct positions and walking leaned over for one year. Also follow up after MRI scan. Thank you for the opportunity to see him in consultation, I will update you after imaging is obtained. Follow-Up Instructions: No Follow-up on file.   Orders:  No orders of the defined types were placed in this encounter.  No orders of the defined types were placed in this encounter.     Procedures: No procedures performed   Clinical Data: No additional findings.   Subjective: Chief Complaint  Patient presents with  . Lower Back - Pain  . Middle Back - Pain    Patient is referred by Dr. Wynn BankerKirsteins for mid and low back pain. He states that he has scoliosis and leans to the left most of the time. The pain is getting worse. He is in pain management and has tried different injections with only temporary relief. He wears a colostomy bag on the right that he has had since birth. He states that leaning to the left causes difficulty due to the colostomy on the right. He wants to discuss if surgery would be helpful. He is taking tylenol #3 and cyclobenzaprine with some relief.     Review of Systems 14 point review systems obtained. Patient had some cholelithiasis to CT scan 2011. 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 Colostomies on the right side. He said scoliosis and states he's had led to 12 months history of significant back pain. He's not able to reach an upright position walks for flexed position. Pain is constant is also noticed some numbness and tingling in his fingers.   Objective: Vital Signs: BP 130/81   Pulse 80   Ht 6' 2.5" (1.892 m)   Wt 150 lb (68 kg)   BMI 19.00 kg/m   Physical Exam  Constitutional: He is oriented to person, place, and time. He appears well-developed and well-nourished.  Patient's than his in the moderate pain slow to get sitting standing walk for flexed position his hips can't reach extension. He has the scoliosis noted with left primary lumbar curve and compensatory right thoracic curve.  HENT:  Head: Normocephalic and atraumatic.  Eyes: EOM are normal. Pupils are equal, round, and reactive to light.  Neck: No tracheal deviation present. No thyromegaly present.  Cardiovascular: Normal rate.   Pulmonary/Chest: Effort normal. He has no wheezes.  Abdominal: Soft. Bowel sounds are normal.  Colostomy bag right side of the abdomen.  Musculoskeletal:  Right thoracic left lumbar curve. Lumbar curve is apex that L2 history-the lumbar spine no pain with hip range of motion. Knee and ankle jerk 1+ symmetrical. Ankle dorsiflexion plantar flexion  is intact. These reach full extension.  Neurological: He is alert and oriented to person, place, and time.  Skin: Skin is warm and dry. Capillary refill takes less than 2 seconds.  Psychiatric: He has a normal mood and affect. His behavior is normal. Judgment and thought content normal.    Ortho Exam no synovitis upper extremities DIP PIP joints good flexion-extension. He states he has some mild numbness the fingertips not the located either radial or ulnar on the right hand than left. Approximately reflexes are 2+ and symmetrical.  Specialty Comments:  No specialty comments available.  Imaging: No results  found.   PMFS History: Patient Active Problem List   Diagnosis Date Noted  . Other idiopathic scoliosis, thoracolumbar region 07/13/2016  . Poor dentition 05/20/2016  . Onychomycosis of toenail 05/20/2016  . Tinea pedis 05/20/2016  . Anaphylactic shock due to adverse food reaction 05/20/2016  . Lumbar spine scoliosis 05/07/2016  . 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 Congenital anomaly of intestine 1975-08-26   Past Medical History:  Diagnosis Date  . Cholelithiasis    CT abd 06/2010  . Congenital anomaly of intestine 07-Sep-1976  . Lumbar scoliosis 06/2010   noted on CT abdomen 06/2010. left convex lumbar     Family History  Problem Relation Age of Onset  . Lupus Father   . Hypertension Mother     Past Surgical History:  Procedure Laterality Date  . COLON SURGERY    . COLOSTOMY     As a baby, does not know why\   Social History   Occupational History  .  Not Employed   Social History Main Topics  . Smoking status: Current Every Day Smoker    Packs/day: 0.10    Types: Cigarettes  . Smokeless tobacco: Never Used  . Alcohol use No  . Drug use: Yes    Types: Marijuana  . Sexual activity: No

## 2016-09-07 NOTE — Addendum Note (Signed)
Addended by: Rogers SeedsYEATTS, Taylyn Brame M on: 09/07/2016 10:47 AM   Modules accepted: Orders

## 2016-09-07 NOTE — Patient Instructions (Signed)
.   Left S1 epidural injection next visit

## 2016-09-12 ENCOUNTER — Other Ambulatory Visit: Payer: Medicaid Other

## 2016-09-13 ENCOUNTER — Ambulatory Visit
Admission: RE | Admit: 2016-09-13 | Discharge: 2016-09-13 | Disposition: A | Discharge status: Home / Self Care | Payer: Medicaid Other | Source: Ambulatory Visit | Attending: Orthopaedic Surgery | Admitting: Orthopaedic Surgery

## 2016-09-13 DIAGNOSIS — M4156 Other secondary scoliosis, lumbar region: Secondary | ICD-10-CM

## 2016-09-14 ENCOUNTER — Ambulatory Visit: Payer: Medicaid Other | Admitting: Physical Medicine & Rehabilitation

## 2016-09-17 ENCOUNTER — Ambulatory Visit (HOSPITAL_BASED_OUTPATIENT_CLINIC_OR_DEPARTMENT_OTHER): Payer: Medicaid Other | Admitting: Physical Medicine & Rehabilitation

## 2016-09-17 ENCOUNTER — Encounter: Payer: Medicaid Other | Attending: Physical Medicine & Rehabilitation

## 2016-09-17 DIAGNOSIS — F17210 Nicotine dependence, cigarettes, uncomplicated: Secondary | ICD-10-CM | POA: Insufficient documentation

## 2016-09-17 DIAGNOSIS — Z5181 Encounter for therapeutic drug level monitoring: Secondary | ICD-10-CM | POA: Insufficient documentation

## 2016-09-17 DIAGNOSIS — Z8249 Family history of ischemic heart disease and other diseases of the circulatory system: Secondary | ICD-10-CM | POA: Diagnosis not present

## 2016-09-17 DIAGNOSIS — M419 Scoliosis, unspecified: Secondary | ICD-10-CM | POA: Diagnosis not present

## 2016-09-17 DIAGNOSIS — Z8719 Personal history of other diseases of the digestive system: Secondary | ICD-10-CM | POA: Diagnosis not present

## 2016-09-17 DIAGNOSIS — M5416 Radiculopathy, lumbar region: Secondary | ICD-10-CM

## 2016-09-17 DIAGNOSIS — M5441 Lumbago with sciatica, right side: Secondary | ICD-10-CM | POA: Diagnosis not present

## 2016-09-17 DIAGNOSIS — Z79899 Other long term (current) drug therapy: Secondary | ICD-10-CM | POA: Insufficient documentation

## 2016-09-17 DIAGNOSIS — G894 Chronic pain syndrome: Secondary | ICD-10-CM | POA: Diagnosis present

## 2016-09-17 MED FILL — CYCLOBENZAPRINE 10 MG TAB: 10 | 30 days supply | Qty: 30 | Fill #0

## 2016-09-17 MED FILL — traMADol HCL 50 MG TABS: 50 | 30 days supply | Qty: 90 | Fill #0

## 2016-09-17 NOTE — Progress Notes (Signed)
Lumbar transforaminal epidural steroid injection under fluoroscopic guidance  Indication: Lumbosacral radiculitis is not relieved by medication management or other conservative care and interfering with self-care and mobility.   Informed consent was obtained after describing risk and benefits of the procedure with the patient, this includes bleeding, bruising, infection, paralysis and medication side effects.  The patient wishes to proceed and has given written consent.  Patient was placed in prone position.  The lumbar area was marked and prepped with Betadine.  It was entered with a 25-gauge 1-1/2 inch needle and one mL of 1% lidocaine was injected into the skin and subcutaneous tissue.  Then a 22-gauge 3.5 spinal needle was inserted into the Left S1 foramina  under AP, lateral, and oblique view.  Then a solution containing one mL of 10 mg per mL dexamethasone and 2 mL of 1% lidocaine was injected.  The patient tolerated procedure well.  Post procedure instructions were given.  Please see post procedure form.

## 2016-09-17 NOTE — Progress Notes (Signed)
  PROCEDURE RECORD [facility]   Name: Rickey Fisher DOB:01/06/1976 MRN: [id number]  Date:09/17/2016  Physician: Claudette LawsAndrew Kirsteins, MD    Nurse/CMA: Bing PlumeHaynes  Allergies:  Allergies  Allergen Reactions  . Banana Anaphylaxis  . Shellfish Allergy Anaphylaxis  . Tomato Anaphylaxis  . Pollen Extract Itching    Consent Signed: Yes.    Is patient diabetic? No.  CBG today?   Pregnant: No. LMP: No LMP for male patient. (age 40-55)  Anticoagulants: no Anti-inflammatory: no Antibiotics: no  Procedure: S1 Transforaminal Epidural Injection Position: Prone Start Time: 1200 End Time: 1203 Fluoro Time: 15  RN/CMA Rickey Fisher    Time 1144 1207    BP 135/80 142/82    Pulse 94 93    Respirations 14 14    O2 Sat 99 99    S/S 6 6    Pain Level 9 4     D/C home with Ray, patient A & O X 3, D/C instructions reviewed, and sits independently.

## 2016-09-17 NOTE — Patient Instructions (Signed)

## 2016-10-05 ENCOUNTER — Ambulatory Visit: Payer: Medicaid Other | Admitting: Physical Medicine & Rehabilitation

## 2016-10-06 ENCOUNTER — Encounter (INDEPENDENT_AMBULATORY_CARE_PROVIDER_SITE_OTHER): Payer: Self-pay | Admitting: Orthopaedic Surgery

## 2016-10-06 ENCOUNTER — Ambulatory Visit (INDEPENDENT_AMBULATORY_CARE_PROVIDER_SITE_OTHER): Payer: Medicaid Other | Admitting: Orthopaedic Surgery

## 2016-10-06 DIAGNOSIS — M5116 Intervertebral disc disorders with radiculopathy, lumbar region: Secondary | ICD-10-CM | POA: Diagnosis not present

## 2016-10-06 DIAGNOSIS — M545 Low back pain, unspecified: Secondary | ICD-10-CM

## 2016-10-06 DIAGNOSIS — M41126 Adolescent idiopathic scoliosis, lumbar region: Secondary | ICD-10-CM | POA: Diagnosis not present

## 2016-10-06 DIAGNOSIS — G8929 Other chronic pain: Secondary | ICD-10-CM | POA: Diagnosis not present

## 2016-10-07 NOTE — Progress Notes (Addendum)
Office Visit Note   Patient: Rickey Fisher           Date of Birth: 11-Nov-1975           MRN: [id number] Visit Date: 10/06/2016              Requested by: Dessa Phi, MD [address] [place], [place] [address] PCP: Lora Paula, MD   Assessment & Plan: Visit Diagnoses:  1. Adolescent idiopathic scoliosis of lumbar region   2. Chronic bilateral low back pain without sciatica   3. Lumbar disc herniation with radiculopathy   4.     Left L1 L5 lumbar curve measuring 35 superior endplate L1 to inferior endplate L5.  Plan: Patient has scoliosis and for the last 3 months he is unable stand up straight has persistent back and left leg pain and weakness. My skin was reviewed with him. Plain radiographs were labeled with 5 lumbar vertebrae. MRI was labeled with 6 lumbar vertebrae. In any event the level above the sacrum which was labeled L6 S1 on MRI scan shows large disc herniation on the left with nerve root compression consistent with his radiculopathy. We had a long discussion with the patient and his mother about options including microdiscectomy for relief of his radiculopathy. We discussed that even with the scoliosis series had in the past he was not having pain problems until he had the disc herniation but is insistent that someone needs to correct a scoliosis at the same time and does not want to proceed with microdiscectomy. Had a long discussion about options with the patient. No past history prior to the current episode of back and radicular pain. I discussed within the scoliosis is not the cause of his pain but he wants it all corrected. I explained to patient and his mother that do not do scoliosis surgery. We will attempt to make appropriate referral to medicine does do scoliosis surgery. Patient's had epidural injections. He's been on different pain medication including tramadol and Tylenol No. 3. He's had multiple anti-inflammatories also Flexeril. We will attempt to make a  referral to Center that does scoliosis work. Follow-up with me will be when necessary. Copy of MRI report given to the patient and discussed.  Follow-Up Instructions: No Follow-up on file.   Orders:  No orders of the defined types were placed in this encounter.  No orders of the defined types were placed in this encounter.     Procedures: No procedures performed   Clinical Data: No additional findings.   Subjective: Chief Complaint  Patient presents with  . Lower Back - Pain, Follow-up    HPI  Review of Systems  Constitutional: Positive for activity change. Negative for chills, diaphoresis, fatigue, fever and unexpected weight change.  HENT: Negative for ear discharge, ear pain and nosebleeds.   Eyes: Negative for discharge and visual disturbance.  Respiratory: Negative for cough, choking and shortness of breath.   Cardiovascular: Negative for chest pain and palpitations.  Gastrointestinal: Negative for abdominal distention and abdominal pain.  Endocrine: Negative for cold intolerance and heat intolerance.  Genitourinary: Negative for flank pain and hematuria.  Musculoskeletal: Positive for back pain.  Skin: Negative for rash and wound.  Neurological: Negative for seizures and speech difficulty.  Hematological: Negative for adenopathy. Does not bruise/bleed easily.  Psychiatric/Behavioral: Negative for agitation and suicidal ideas.     Objective: Vital Signs: There were no vitals taken for this visit.  Physical Exam  Constitutional: He is oriented to  person, place, and time. He appears well-developed and well-nourished.  HENT:  Head: Normocephalic and atraumatic.  Eyes: EOM are normal. Pupils are equal, round, and reactive to light.  Neck: No tracheal deviation present. No thyromegaly present.  Cardiovascular: Normal rate.   Pulmonary/Chest: Effort normal. He has no wheezes.  Abdominal: Soft. Bowel sounds are normal.  Musculoskeletal:  Scoliosis with left  lumbar curve. Patient is slow to get from sitting standing he walks with his hips forward flex. Positive sciatic notch tenderness on the left negative on the right. Positive popliteal compression test on the left negative on the right. Left lateral and plantar foot numbness. He has difficulty with single stance toe raising on the left. No calf atrophy noted.  Neurological: He is alert and oriented to person, place, and time.  Skin: Skin is warm and dry. Capillary refill takes less than 2 seconds.  Psychiatric: He has a normal mood and affect. His behavior is normal. Judgment and thought content normal.    Ortho Exam right and left anterior tib EHL is strong. Left one half grade peroneal weakness. Scoliosis noted in standing position more prominent lumbar curve then thoracic curve. Quads are strong.  Specialty Comments:  No specialty comments available.  Imaging: contrast  Study Result   CLINICAL DATA:  Chronic low back pain, scoliosis  EXAM: MRI LUMBAR SPINE WITHOUT CONTRAST  TECHNIQUE: Multiplanar, multisequence MR imaging of the lumbar spine was performed. No intravenous contrast was administered.  COMPARISON:  None.  FINDINGS: Segmentation:  Standard.  Alignment:  No static listhesis.  Levoscoliosis of the lumbar spine.  Vertebrae:  No fracture, evidence of discitis, or bone lesion.  Conus medullaris: Extends to the L1 level and appears normal.  Paraspinal and other soft tissues: No paraspinal abnormality.  Disc levels:  Disc spaces: Degenerative disc disease with disc height loss at L3-4, L4-5 and L5-S1.  T12-L1: No significant disc bulge. No evidence of neural foraminal stenosis. No central canal stenosis.  L1-2: No significant disc bulge. No evidence of neural foraminal stenosis. No central canal stenosis.  L2-3: Broad-based disc bulge. Mild bilateral facet arthropathy. Bilateral lateral recess narrowing. No evidence of neural foraminal stenosis.  No central canal stenosis.  L3-4: Mild broad-based disc bulge. Mild bilateral facet arthropathy. Bilateral lateral recess narrowing. No evidence of neural foraminal stenosis. No central canal stenosis.  L4-5: Broad-based disc bulge eccentric towards the right. Mild right foraminal stenosis. Right lateral recess stenosis. Moderate right and mild left facet arthropathy. No central canal stenosis.  L5-S1: Broad-based disc bulge with a left paracentral/ foraminal disc protrusion with mass effect on the left intraspinal S1 nerve root. Right lateral recess stenosis. Mild bilateral facet arthropathy.  IMPRESSION: 1. Six lumbar type vertebral bodies. 2. At L6-S1 there is a broad-based disc bulge with a left paracentral/ foraminal disc protrusion with mass effect on the left intraspinal S1 nerve root. Right lateral recess stenosis. Mild bilateral facet arthropathy. 3. At L5-6 there is a broad-based disc bulge eccentric towards the right. Mild right foraminal stenosis. Right lateral recess stenosis. Moderate right and mild left facet arthropathy. 4. Levoscoliosis of the lumbar spine.   Electronically Signed   By: Elige KoHetal  Patel   On: 09/13/2016 20:07       PMFS History: Patient Active Problem List   Diagnosis Date Noted  . Left lumbar radiculitis 09/07/2016  . Other idiopathic scoliosis, thoracolumbar region 07/13/2016  . Poor dentition 05/20/2016  . Onychomycosis of toenail 05/20/2016  . Tinea pedis 05/20/2016  . Anaphylactic  shock due to adverse food reaction 05/20/2016  . Lumbar spine scoliosis 05/07/2016  . 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 Congenital anomaly of intestine November 09, 1975   Past Medical History:  Diagnosis Date  . Cholelithiasis    CT abd 06/2010  . Congenital anomaly of intestine 07/17/77  . Lumbar scoliosis 06/2010   noted on CT abdomen 06/2010. left convex lumbar     Family History  Problem Relation Age of Onset  . Lupus Father   .  Hypertension Mother     Past Surgical History:  Procedure Laterality Date  . COLON SURGERY    . COLOSTOMY     As a baby, does not know why\   Social History   Occupational History  .  Not Employed   Social History Main Topics  . Smoking status: Current Every Day Smoker    Packs/day: 0.10    Types: Cigarettes  . Smokeless tobacco: Never Used  . Alcohol use No  . Drug use: Yes    Types: Marijuana  . Sexual activity: No

## 2016-10-13 ENCOUNTER — Other Ambulatory Visit (INDEPENDENT_AMBULATORY_CARE_PROVIDER_SITE_OTHER): Payer: Self-pay | Admitting: Radiology

## 2016-10-13 DIAGNOSIS — M41126 Adolescent idiopathic scoliosis, lumbar region: Secondary | ICD-10-CM

## 2016-10-13 DIAGNOSIS — M4125 Other idiopathic scoliosis, thoracolumbar region: Secondary | ICD-10-CM

## 2016-10-28 ENCOUNTER — Ambulatory Visit (HOSPITAL_BASED_OUTPATIENT_CLINIC_OR_DEPARTMENT_OTHER): Payer: Medicaid Other | Admitting: Physical Medicine & Rehabilitation

## 2016-10-28 ENCOUNTER — Encounter: Payer: Self-pay | Admitting: Physical Medicine & Rehabilitation

## 2016-10-28 ENCOUNTER — Encounter: Payer: Medicaid Other | Attending: Physical Medicine & Rehabilitation

## 2016-10-28 VITALS — BP 119/81 | HR 85 | Resp 16

## 2016-10-28 DIAGNOSIS — M5441 Lumbago with sciatica, right side: Secondary | ICD-10-CM | POA: Diagnosis not present

## 2016-10-28 DIAGNOSIS — G894 Chronic pain syndrome: Secondary | ICD-10-CM | POA: Diagnosis present

## 2016-10-28 DIAGNOSIS — M419 Scoliosis, unspecified: Secondary | ICD-10-CM | POA: Insufficient documentation

## 2016-10-28 DIAGNOSIS — M5416 Radiculopathy, lumbar region: Secondary | ICD-10-CM | POA: Diagnosis not present

## 2016-10-28 DIAGNOSIS — Z79899 Other long term (current) drug therapy: Secondary | ICD-10-CM | POA: Diagnosis not present

## 2016-10-28 DIAGNOSIS — F17210 Nicotine dependence, cigarettes, uncomplicated: Secondary | ICD-10-CM | POA: Insufficient documentation

## 2016-10-28 DIAGNOSIS — Z8249 Family history of ischemic heart disease and other diseases of the circulatory system: Secondary | ICD-10-CM | POA: Insufficient documentation

## 2016-10-28 DIAGNOSIS — Z8719 Personal history of other diseases of the digestive system: Secondary | ICD-10-CM | POA: Insufficient documentation

## 2016-10-28 DIAGNOSIS — Z5181 Encounter for therapeutic drug level monitoring: Secondary | ICD-10-CM | POA: Insufficient documentation

## 2016-10-28 MED ORDER — TRAMADOL HCL 50 MG PO TABS: 50.0000 mg | ORAL_TABLET | Freq: Three times a day (TID) | ORAL | 1 refills | Status: DC | PRN

## 2016-10-28 MED ORDER — GABAPENTIN 100 MG PO CAPS: 100.0000 mg | ORAL_CAPSULE | Freq: Three times a day (TID) | ORAL | 1 refills | Status: DC

## 2016-10-28 MED FILL — traMADol HCL 50 MG TABS: 50 | 30 days supply | Qty: 90 | Fill #0

## 2016-10-28 MED FILL — GABAPENTIN 100 MG CAPSULE: 100 | 30 days supply | Qty: 90 | Fill #0

## 2016-10-28 NOTE — Progress Notes (Signed)
Lumbar transforaminal epidural steroid injection under fluoroscopic guidance  Indication: Lumbosacral radiculitis is not relieved by medication management or other conservative care and interfering with self-care and mobility.   Informed consent was obtained after describing risk and benefits of the procedure with the patient, this includes bleeding, bruising, infection, paralysis and medication side effects.  The patient wishes to proceed and has given written consent.  Patient was placed in prone position.  The lumbar area was marked and prepped with Betadine.  It was entered with a 25-gauge 1-1/2 inch needle and one mL of 1% lidocaine was injected into the skin and subcutaneous tissue.  Then a 22-gauge 3.5 spinal needle was inserted into the Left S1 foramina  under AP, lateral, and oblique view.  Then a solution containing one mL of 10 mg per mL dexamethasone and 2 mL of 1% lidocaine was injected.  The patient tolerated procedure well.  Post procedure instructions were given.  Please see post procedure form.  [facility] assess for scoliosis surgery. Will schedule next injection for 2 months. However, if he has had surgery in the interim, we can cancel

## 2016-10-28 NOTE — Patient Instructions (Signed)

## 2016-10-28 NOTE — Progress Notes (Signed)
  PROCEDURE RECORD [facility]   Name: Rayna SextonCharles Hinchcliff DOB:11/13/1975 MRN: [id number]  Date:10/28/2016  Physician: Claudette LawsAndrew Kirsteins, MD    Nurse/CMA: Jameshia Hayashida, CMA  Allergies:  Allergies  Allergen Reactions  . Banana Anaphylaxis  . Shellfish Allergy Anaphylaxis  . Tomato Anaphylaxis  . Pollen Extract Itching    Consent Signed: Yes.    Is patient diabetic? No.  CBG today?   Pregnant: No. LMP: No LMP for male patient. (age 40-55)  Anticoagulants: no Anti-inflammatory: no Antibiotics: no  Procedure: left S1 transforaminal epidural steroid injection  Position: Prone Start Time: 9:37       End Time: 9:41am  Fluoro Time: 12  RN/CMA Shakeila Pfarr, CMA Maranda Marte, CMA    Time 9:20am 9:46am    BP 119/81 131/91    Pulse 85 85    Respirations 16 16    O2 Sat 98 99    S/S 6 6    Pain Level 8/10 3/10     D/C home with Aunt, patient A & O X 3, D/C instructions reviewed, and sits independently.

## 2016-11-29 ENCOUNTER — Ambulatory Visit: Payer: Medicaid Other | Attending: Family Medicine

## 2016-12-22 ENCOUNTER — Encounter: Payer: Self-pay | Admitting: Family Medicine

## 2016-12-23 ENCOUNTER — Ambulatory Visit: Payer: Medicaid Other | Admitting: Physical Medicine & Rehabilitation

## 2016-12-23 ENCOUNTER — Encounter: Payer: Medicaid Other | Attending: Physical Medicine & Rehabilitation

## 2016-12-23 DIAGNOSIS — M419 Scoliosis, unspecified: Secondary | ICD-10-CM | POA: Insufficient documentation

## 2016-12-23 DIAGNOSIS — F17210 Nicotine dependence, cigarettes, uncomplicated: Secondary | ICD-10-CM | POA: Insufficient documentation

## 2016-12-23 DIAGNOSIS — M5441 Lumbago with sciatica, right side: Secondary | ICD-10-CM | POA: Insufficient documentation

## 2016-12-23 DIAGNOSIS — Z8249 Family history of ischemic heart disease and other diseases of the circulatory system: Secondary | ICD-10-CM | POA: Insufficient documentation

## 2016-12-23 DIAGNOSIS — Z79899 Other long term (current) drug therapy: Secondary | ICD-10-CM | POA: Insufficient documentation

## 2016-12-23 DIAGNOSIS — G894 Chronic pain syndrome: Secondary | ICD-10-CM | POA: Insufficient documentation

## 2016-12-23 DIAGNOSIS — Z8719 Personal history of other diseases of the digestive system: Secondary | ICD-10-CM | POA: Insufficient documentation

## 2016-12-23 DIAGNOSIS — Z5181 Encounter for therapeutic drug level monitoring: Secondary | ICD-10-CM | POA: Insufficient documentation

## 2017-02-21 ENCOUNTER — Ambulatory Visit: Payer: Medicaid Other | Attending: Family Medicine

## 2017-02-21 DIAGNOSIS — Z981 Arthrodesis status: Secondary | ICD-10-CM | POA: Insufficient documentation

## 2017-02-21 DIAGNOSIS — M6281 Muscle weakness (generalized): Secondary | ICD-10-CM | POA: Insufficient documentation

## 2017-02-21 DIAGNOSIS — M62830 Muscle spasm of back: Secondary | ICD-10-CM | POA: Diagnosis present

## 2017-02-21 DIAGNOSIS — R262 Difficulty in walking, not elsewhere classified: Secondary | ICD-10-CM | POA: Diagnosis present

## 2017-02-21 DIAGNOSIS — R29898 Other symptoms and signs involving the musculoskeletal system: Secondary | ICD-10-CM | POA: Diagnosis present

## 2017-02-21 NOTE — Therapy (Signed)
[facility] [address] [place], [place], [address] Phone: [phone]   Fax:  [phone]  Physical Therapy Evaluation  Patient Details  Name: Rickey Fisher MRN: [id number] Date of Birth: 07/01/1976 Referring Provider: Kendra OpitzNicolas Joyner , FNP  Encounter Date: 02/21/2017      PT End of Session - 02/21/17 0850    Visit Number 1   Number of Visits 9   Date for PT Re-Evaluation 04/22/17   Authorization Type MCD   PT Start Time 0848   PT Stop Time 0945   PT Time Calculation (min) 57 min   Equipment Utilized During Treatment Gait belt   Activity Tolerance Patient tolerated treatment well;No increased pain   Behavior During Therapy WFL for tasks assessed/performed      Past Medical History:  Diagnosis Date  . Cholelithiasis    CT abd 06/2010  . 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 Lumbar scoliosis 06/2010   noted on CT abdomen 06/2010. left convex lumbar     Past Surgical History:  Procedure Laterality Date  . COLON SURGERY    . COLOSTOMY     As a baby, does not know why\    There were no vitals filed for this visit.       Subjective Assessment - 02/21/17 0855    Subjective He reports post spinal fusion with scoliosis with DDD.    No PT at home.      Limitations Walking;Standing;House hold activities;Sitting   How long can you sit comfortably? 30 min based on chair   How long can you stand comfortably? 10 min   How long can you walk comfortably? 30 min with walker   Patient Stated Goals He wants to walk without device.      Currently in Pain? Yes   Pain Score 8    Pain Location Back   Pain Orientation Left;Lower   Pain Descriptors / Indicators Sharp;Throbbing   Pain Type Chronic pain   Pain Onset More than a month ago   Pain Frequency Constant   Aggravating Factors  old , weather change,   over doing activity   Pain Relieving Factors meds , rest lying    Multiple Pain Sites No            [facility]  PT Assessment - 02/21/17 0001      Assessment   Medical Diagnosis spinal fusion T10 to S1   Referring Provider Kendra OpitzNicolas Joyner , FNP   Onset Date/Surgical Date 12/17/16   Hand Dominance Right   Next MD Visit 03/10/17   Prior Therapy In hospital , no HHPT     Precautions   Precautions None     Restrictions   Weight Bearing Restrictions No     Balance Screen   Has the patient fallen in the past 6 months No   Has the patient had a decrease in activity level because of a fear of falling?  Yes  due to surgery   Is the patient reluctant to leave their home because of a fear of falling?  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      ROM / Strength   AROM / PROM / Strength AROM;Strength     AROM   Overall AROM Comments Did not do trunk mpotions and needed asssitance for all LE and UE ROM  .  UE WNL but with pain ,  [facility] but all with pain.        Strength   Overall Strength Comments Unable to assess as he was reluctant to give effort due to pain.   he did give resistance for most tesitng      Flexibility   Soft Tissue Assessment /Muscle Length yes     Palpation   Palpation comment Tender ant tight Lt side spine paraspinals along incision.      Bed  Mobility   Bed Mobility --  slow ans needs assist to get up and down from mat     Ambulation/Gait   Gait Comments Walks with RW  but with gait belt and +1 CG              Objective measurements completed on examination: See above findings.          [facility] Adult PT Treatment/Exercise - 02/21/17 0001      Exercises   Exercises Lumbar     Modalities   Modalities Moist Heat     Moist Heat Therapy   Number Minutes Moist Heat 12 Minutes   Moist Heat Location Lumbar Spine                PT Education - 02/21/17 1031    Education provided Yes   Education Details POC Limitations of Medicaid , 3 visits   Person(s) Educated Patient   Methods Explanation   Comprehension Verbalized understanding          PT Short Term Goals - 02/21/17 0846      PT SHORT TERM GOAL #1   Title Patient will be independent with basic HEP    Baseline No program   Time 4   Period Weeks   Status New     PT SHORT TERM GOAL #2   Title get self on off mat with good pace and min pain   Baseline needs assist to get off mat/bed or excessive effort   Time 4   Period Weeks     PT SHORT TERM GOAL #3   Title Pain will be decreased 25% or more in lower back   Baseline 8/10 pain   Time 4   Period Weeks   Status New     PT SHORT TERM GOAL #4   Title He will be able to walk with SPC 200 feet  safely with supervision   Baseline needs RW for all ambulation   Time 4   Period Weeks   Status New     PT SHORT TERM GOAL #5   Title He will be able to raise arms overhead without incr pain   Baseline painful and unable to raise full range   Time 4   Period Weeks   Status New  PT Long Term Goals - 02/21/17 0847      PT LONG TERM GOAL #1   Title He will be independent with all hEP issued   Baseline independent with initial HEP   Time 8   Period Weeks   Status New     PT LONG TERM GOAL #2   Title He will report pain decreased 50% or more with mobility activity    Baseline pain improved 25% or more   Time 8   Period Weeks   Status New     PT LONG TERM GOAL #3   Title He will be able to  walk without device in home safely   Baseline walking wit unilateral device   Time 8   Period Weeks   Status New     PT LONG TERM GOAL #4   Title He will be able to   stand from chair without UE assist   Baseline needs both UE   Time 8   Period Weeks   Status New     PT LONG TERM GOAL #5   Title He will be able to   provide self care without assist and pain decr to 3-4/10   Baseline Gets assist with dressing , bath    Time 8   Period Weeks   Status New     Additional Long Term Goals   Additional Long Term Goals Yes     PT LONG TERM GOAL #6   Title foto SCORE DECR TO 50% LIMITED OR LESS   Time 8   Period Weeks   Status New                Plan - 02/21/17 1008    Clinical Impression Statement Mr Siverson presents post spinal fusion( CPT [phone], ICD 10 Z98.1) with pain, stiffness, limited mobility and ROM /  He spends most time lying down on incline. He needs  to be upright more and work on active ROM and walking diistance,   He needs to work on breathing and relaxing with moving.  He needs more strength/.    History and Personal Factors relevant to plan of care: colostomy, multilevel fusion , limited activity since surgery.    Clinical Presentation Evolving   Clinical Presentation due to: continued significant pain and limited mobility.    Clinical Decision Making Moderate   Rehab Potential Good   PT Frequency 1x / week   PT Duration 8 weeks   PT Treatment/Interventions Moist Heat;Passive range of motion;Patient/family education;Manual techniques;Therapeutic exercise;Therapeutic activities;Dry needling;Balance training   PT Next Visit Plan HEP for LE ROM and strength    REVIES MEDICAID LIMITS OF ONLY 3 VISITS , MISTAKENLY TOLD HIM 8 VISITS   Consulted and Agree with Plan of Care Patient      Patient will benefit from skilled therapeutic  intervention in order to improve the following deficits and impairments:  Decreased range of motion, Difficulty walking, Increased muscle spasms, Pain, Decreased activity tolerance, Decreased balance, Decreased strength, Postural dysfunction, Decreased mobility  Visit Diagnosis: History of lumbar spinal fusion - Plan: PT plan of care cert/re-cert  Muscle weakness (generalized) - Plan: PT plan of care cert/re-cert  Muscle spasm of back - Plan: PT plan of care cert/re-cert  Difficulty in walking, not elsewhere classified - Plan: PT plan of care cert/re-cert  Other symptoms and signs involving the musculoskeletal system - Plan: PT plan of care cert/re-cert     Problem List Patient Active Problem List  Diagnosis Date Noted  . Left lumbar radiculitis 09/07/2016  . Other idiopathic scoliosis, thoracolumbar region 07/13/2016  . Poor dentition 05/20/2016  . Onychomycosis of toenail 05/20/2016  . Tinea pedis 05/20/2016  . Anaphylactic shock due to adverse food reaction 05/20/2016  . Lumbar spine scoliosis 05/07/2016  . 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 Congenital anomaly of intestine 1976/05/26    Caprice Red  PT 02/21/2017, 10:35 AM  [facility] [address] [place], [place], [address] Phone: [phone]   Fax:  [phone]  Name: Rickey Fisher MRN: [id number] Date of Birth: 07-12-1976

## 2017-03-11 ENCOUNTER — Ambulatory Visit: Payer: Medicaid Other | Attending: Family Medicine | Admitting: Physical Therapy

## 2017-03-11 ENCOUNTER — Encounter: Payer: Self-pay | Admitting: Physical Therapy

## 2017-03-11 DIAGNOSIS — M545 Low back pain, unspecified: Secondary | ICD-10-CM

## 2017-03-11 DIAGNOSIS — Z981 Arthrodesis status: Secondary | ICD-10-CM | POA: Diagnosis not present

## 2017-03-11 DIAGNOSIS — R262 Difficulty in walking, not elsewhere classified: Secondary | ICD-10-CM | POA: Insufficient documentation

## 2017-03-11 DIAGNOSIS — M62830 Muscle spasm of back: Secondary | ICD-10-CM | POA: Insufficient documentation

## 2017-03-11 DIAGNOSIS — R29898 Other symptoms and signs involving the musculoskeletal system: Secondary | ICD-10-CM | POA: Insufficient documentation

## 2017-03-11 DIAGNOSIS — M6281 Muscle weakness (generalized): Secondary | ICD-10-CM | POA: Diagnosis present

## 2017-03-11 NOTE — Therapy (Signed)
[facility] [address] [place], [place], [address] Phone: [phone]   Fax:  [phone]  Physical Therapy Treatment  Patient Details  Name: Rickey Fisher MRN: [id number] Date of Birth: August 31, 1975 Referring Provider: Kendra Opitz , FNP  Encounter Date: 03/11/2017      PT End of Session - 03/11/17 1238    Visit Number 2   Number of Visits 9  modified to 4 for Medicaid   Date for PT Re-Evaluation 04/22/17   Authorization Type MCD   Authorization - Number of Visits 4   PT Start Time 1152   PT Stop Time 1235   PT Time Calculation (min) 43 min   Activity Tolerance Patient tolerated treatment well   Behavior During Therapy [facility] for tasks assessed/performed      Past Medical History:  Diagnosis Date  . Cholelithiasis    CT abd 06/2010  . Congenital anomaly of intestine [birth year]1977  . Lumbar scoliosis 06/2010   noted on CT abdomen 06/2010. left convex lumbar     Past Surgical History:  Procedure Laterality Date  . COLON SURGERY    . COLOSTOMY     As a baby, does not know why\    There were no vitals filed for this visit.      Subjective Assessment - 03/11/17 1153    Subjective "So far since the last session, i've been sore"   Currently in Pain? Yes   Pain Score 6    Pain Location Back   Pain Orientation Left;Lower   Pain Type Chronic pain   Pain Onset More than a month ago   Pain Frequency Constant   Aggravating Factors  weather, sitting up for too long    Pain Relieving Factors meds, rest lying                         [facility] Adult PT Treatment/Exercise - 03/11/17 1159      Lumbar Exercises: Stretches   Lower Trunk Rotation 5 reps   Pelvic Tilt --  1 x 10 with 5 sec hold     Lumbar Exercises: Aerobic   Stationary Bike L3 x 6 min UE/LE     Lumbar Exercises: Seated   Hip Flexion on Ball Limitations seated pelvic 1 x 10  ball press into legs 2 x 10 breathing out with press      Lumbar  Exercises: Supine   Ab Set 10 reps;3 seconds   Bent Knee Raise 10 reps  2 x 10 with ADIM   Other Supine Lumbar Exercises supine <> sit with proper form x 5 log rollin  demonstration/ verbal cues for proper form                PT Education - 03/11/17 1242    Education provided Yes   Education Details explained to pt Medicaid only provided 3 treatment sessions. proper log rolling and bed mobility/ updated HEP   Person(s) Educated Patient   Methods Explanation;Verbal cues   Comprehension Verbalized understanding;Verbal cues required          PT Short Term Goals - 02/21/17 0846      PT SHORT TERM GOAL #1   Title Patient will be independent with basic HEP    Baseline No program   Time 4   Period Weeks   Status New     PT SHORT TERM GOAL #2   Title get self on off mat with good pace and  min pain   Baseline needs assist to get off mat/bed or excessive effort   Time 4   Period Weeks     PT SHORT TERM GOAL #3   Title Pain will be decreased 25% or more in lower back   Baseline 8/10 pain   Time 4   Period Weeks   Status New     PT SHORT TERM GOAL #4   Title He will be able to walk with SPC 200 feet  safely with supervision   Baseline needs RW for all ambulation   Time 4   Period Weeks   Status New     PT SHORT TERM GOAL #5   Title He will be able to raise arms overhead without incr pain   Baseline painful and unable to raise full range   Time 4   Period Weeks   Status New           PT Long Term Goals - 02/21/17 0847      PT LONG TERM GOAL #1   Title He will be independent with all hEP issued   Baseline independent with initial HEP   Time 8   Period Weeks   Status New     PT LONG TERM GOAL #2   Title He will report pain decreased 50% or more with mobility activity   Baseline pain improved 25% or more   Time 8   Period Weeks   Status New     PT LONG TERM GOAL #3   Title He will be able to  walk without device in home safely   Baseline walking  wit unilateral device   Time 8   Period Weeks   Status New     PT LONG TERM GOAL #4   Title He will be able to   stand from chair without UE assist   Baseline needs both UE   Time 8   Period Weeks   Status New     PT LONG TERM GOAL #5   Title He will be able to   provide self care without assist and pain decr to 3-4/10   Baseline Gets assist with dressing , bath    Time 8   Period Weeks   Status New     Additional Long Term Goals   Additional Long Term Goals Yes     PT LONG TERM GOAL #6   Title foto SCORE DECR TO 50% LIMITED OR LESS   Time 8   Period Weeks   Status New               Plan - 03/11/17 1239    Clinical Impression Statement pt reported 6/10 pain arriving to treatment today. educated about bed mobility which pt required demonstration and verbal cues on proper form. Focused on core strengthening and LE strengthening in sitting/ supine position. post session he reported pain dropped to 2/10 and declined modaliites.   PT Next Visit Plan update HEP PRN, LE strengthening, progress sitting tolerance and core strengthening,   PT Home Exercise Plan bed mobility/ log rolling, seated marching, supine marching, LAQ, supine pelvic tilt and seated pelvic tilt, lower trunk rotation.    Consulted and Agree with Plan of Care Patient      Patient will benefit from skilled therapeutic intervention in order to improve the following deficits and impairments:  Decreased range of motion, Difficulty walking, Increased muscle spasms, Pain, Decreased activity tolerance, Decreased balance, Decreased strength, Postural dysfunction, Decreased  mobility  Visit Diagnosis: History of lumbar spinal fusion  Muscle weakness (generalized)  Muscle spasm of back  Difficulty in walking, not elsewhere classified  Other symptoms and signs involving the musculoskeletal system  Acute bilateral low back pain without sciatica     Problem List Patient Active Problem List   Diagnosis  Date Noted  . Left lumbar radiculitis 09/07/2016  . Other idiopathic scoliosis, thoracolumbar region 07/13/2016  . Poor dentition 05/20/2016  . Onychomycosis of toenail 05/20/2016  . Tinea pedis 05/20/2016  . Anaphylactic shock due to adverse food reaction 05/20/2016  . Lumbar spine scoliosis 05/07/2016  . 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 Congenital anomaly of intestine 12-04-1975   Lulu RidingKristoffer Coraleigh Sheeran PT, DPT, LAT, ATC  03/11/17  12:45 PM       [facility] [address] [place], [place], [address] Phone: [phone]   Fax:  [phone]  Name: Rickey Fisher MRN: [id number] Date of Birth: 12/02/1975

## 2017-03-15 ENCOUNTER — Ambulatory Visit: Payer: Medicaid Other | Admitting: Physical Therapy

## 2017-03-15 ENCOUNTER — Encounter: Payer: Self-pay | Admitting: Physical Therapy

## 2017-03-15 DIAGNOSIS — M545 Low back pain, unspecified: Secondary | ICD-10-CM

## 2017-03-15 DIAGNOSIS — Z981 Arthrodesis status: Secondary | ICD-10-CM

## 2017-03-15 DIAGNOSIS — M6281 Muscle weakness (generalized): Secondary | ICD-10-CM

## 2017-03-15 DIAGNOSIS — R29898 Other symptoms and signs involving the musculoskeletal system: Secondary | ICD-10-CM

## 2017-03-15 DIAGNOSIS — M62830 Muscle spasm of back: Secondary | ICD-10-CM

## 2017-03-15 DIAGNOSIS — R262 Difficulty in walking, not elsewhere classified: Secondary | ICD-10-CM

## 2017-03-15 NOTE — Therapy (Signed)
[place], [place], [address] Phone: [phone]   Fax:  [phone]  Physical Therapy Treatment  Patient Details  Name: Rickey Fisher MRN: [id number] Date of Birth: 1976/04/03 Referring Provider: Marga Hoots , FNP  Encounter Date: 03/15/2017      PT End of Session - 03/15/17 1324    Visit Number 3   Number of Visits 4   Date for PT Re-Evaluation 04/22/17   Authorization Type MCD   Authorization - Number of Visits 4   PT Start Time 1100   PT Stop Time 1155   PT Time Calculation (min) 55 min   Activity Tolerance Patient tolerated treatment well   Behavior During Therapy [facility] for tasks assessed/performed      Past Medical History:  Diagnosis Date  . Cholelithiasis    CT abd 06/2010  . Congenital anomaly of intestine [birth year]Nov-1976  . Lumbar scoliosis 06/2010   noted on CT abdomen 06/2010. left convex lumbar     Past Surgical History:  Procedure Laterality Date  . COLON SURGERY    . COLOSTOMY     As a baby, does not know why\    There were no vitals filed for this visit.      Subjective Assessment - 03/15/17 1123    Subjective My back gets sore with the change in weather.     Currently in Pain? Yes   Pain Score 2    Pain Location Back   Pain Orientation Left   Pain Descriptors / Indicators Sore   Pain Type Chronic pain   Pain Onset More than a month ago   Pain Frequency Constant   Aggravating Factors  colder damp weather, moving too quickly, not thinking about it, walking does not incr pain    Pain Relieving Factors meds, rest            [facility] PT Assessment - 03/15/17 0001      Strength   Right Hip Flexion 4/5   Left Hip Flexion 4/5   Right Knee Flexion 4/5   Right Knee Extension 3+/5   Left Knee Flexion 3+/5   Left Knee Extension 4/5            [facility] Adult PT Treatment/Exercise - 03/15/17 0001      Lumbar Exercises: Stretches   Active Hamstring Stretch 2 reps;60  seconds   Active Hamstring Stretch Limitations x 2, seated and supine    Single Knee to Chest Stretch 2 reps;30 seconds   Lower Trunk Rotation 10 seconds   Lower Trunk Rotation Limitations x 10      Lumbar Exercises: Aerobic   Stationary Bike L5 x 6 min UE/LE     Lumbar Exercises: Supine   Ab Set 10 reps;3 seconds   Bent Knee Raise 10 reps  2 x 10 with ADIM   Straight Leg Raise 20 reps     Lumbar Exercises: Sidelying   Hip Abduction 20 reps     Modalities   Modalities Moist Heat     Moist Heat Therapy   Number Minutes Moist Heat 10 Minutes   Moist Heat Location Lumbar Spine                PT Education - 03/15/17 1323    Education provided Yes   Education Details strengthening HEP    Person(s) Educated Patient   Methods Explanation;Handout;Verbal cues;Tactile cues   Comprehension Verbalized understanding;Returned demonstration;Need further instruction  PT Short Term Goals - 03/15/17 1325      PT SHORT TERM GOAL #1   Title Patient will be independent with basic HEP    Status Achieved     PT SHORT TERM GOAL #2   Title get self on off mat with good pace and min pain   Baseline slow   Status Partially Met     PT SHORT TERM GOAL #3   Title Pain will be decreased 25% or more in lower back   Status Achieved     PT SHORT TERM GOAL #4   Title He will be able to walk with SPC 200 feet  safely with supervision   Baseline needs RW for all ambulation   Status On-going           PT Long Term Goals - 02/21/17 0847      PT LONG TERM GOAL #1   Title He will be independent with all hEP issued   Baseline independent with initial HEP   Time 8   Period Weeks   Status New     PT LONG TERM GOAL #2   Title He will report pain decreased 50% or more with mobility activity   Baseline pain improved 25% or more   Time 8   Period Weeks   Status New     PT LONG TERM GOAL #3   Title He will be able to  walk without device in home safely   Baseline  walking wit unilateral device   Time 8   Period Weeks   Status New     PT LONG TERM GOAL #4   Title He will be able to   stand from chair without UE assist   Baseline needs both UE   Time 8   Period Weeks   Status New     PT LONG TERM GOAL #5   Title He will be able to   provide self care without assist and pain decr to 3-4/10   Baseline Gets assist with dressing , bath    Time 8   Period Weeks   Status New     Additional Long Term Goals   Additional Long Term Goals Yes     PT LONG TERM GOAL #6   Title foto SCORE DECR TO 50% LIMITED OR LESS   Time 8   Period Weeks   Status New               Plan - 03/15/17 1324    Clinical Impression Statement Pain overall minimal today, moves slowly and cautiously.  Worked on LE strengthening program.  Able to do without pain.     PT Next Visit Plan  LE strengthening, progress sitting tolerance and core strengthening,talk to him about group PT    PT Home Exercise Plan bed mobility/ log rolling, seated marching, supine marching, LAQ, supine pelvic tilt and seated pelvic tilt, lower trunk rotation. added bridge, SLR flex and abduction and hamstring stretch    Consulted and Agree with Plan of Care Patient      Patient will benefit from skilled therapeutic intervention in order to improve the following deficits and impairments:  Decreased range of motion, Difficulty walking, Increased muscle spasms, Pain, Decreased activity tolerance, Decreased balance, Decreased strength, Postural dysfunction, Decreased mobility  Visit Diagnosis: History of lumbar spinal fusion  Muscle weakness (generalized)  Muscle spasm of back  Difficulty in walking, not elsewhere classified  Other symptoms and signs involving the musculoskeletal  system  Acute bilateral low back pain without sciatica     Problem List Patient Active Problem List   Diagnosis Date Noted  . Left lumbar radiculitis 09/07/2016  . Other idiopathic scoliosis, thoracolumbar  region 07/13/2016  . Poor dentition 05/20/2016  . Onychomycosis of toenail 05/20/2016  . Tinea pedis 05/20/2016  . Anaphylactic shock due to adverse food reaction 05/20/2016  . Lumbar spine scoliosis 05/07/2016  . Ileostomy present ([place]) 04/07/2012  . Congenital anomaly of intestine October 21, 1975    Tyrina Hines 03/15/2017, 1:28 PM  [facility] [address] [place], [place], [address] Phone: [phone]   Fax:  [phone]  Name: Rickey Fisher MRN: [id number] Date of Birth: 02-19-1976  Raeford Razor, PT 03/15/17 1:28 PM Phone: [phone] Fax: [phone]

## 2017-03-15 NOTE — Patient Instructions (Signed)
Straight Leg Raise    Tighten stomach and slowly raise locked right leg __8-10__ inches from floor. Repeat __10__ times per set. Do ___2-3_ sets per session. Do __2-3__ sessions per day.  http://orth.exer.us/1102   Copyright  VHI. All rights reserved.    Bridging    Slowly raise buttocks from floor, keeping stomach tight. Repeat __10__ times per set. Do __2-3__ sets per session. Do __2-3__ sessions per day.  http://orth.exer.us/1096   Copyright  VHI. All rights reserved.   HIP: Hamstrings - Short Sitting    Rest leg on raised surface. Keep knee straight. Lift chest. Hold __30_ seconds. _3__ reps per set, _2-3__ sets per day, _7__ days per week  Copyright  VHI. All rights reserved.    ABDUCTION: Side-Lying (Active)    Lie on left side, top leg straight. Raise top leg as far as possible. Complete ___2-3 sets of _10__ repetitions. Perform _2-3__ sessions per day.  http://gtsc.exer.us/94   Copyright  VHI. All rights reserved.

## 2017-03-29 ENCOUNTER — Ambulatory Visit: Payer: Medicaid Other

## 2017-03-30 ENCOUNTER — Ambulatory Visit: Payer: Medicaid Other | Admitting: Physical Therapy

## 2017-03-30 ENCOUNTER — Encounter: Payer: Self-pay | Admitting: Physical Therapy

## 2017-03-30 DIAGNOSIS — M62830 Muscle spasm of back: Secondary | ICD-10-CM

## 2017-03-30 DIAGNOSIS — M6281 Muscle weakness (generalized): Secondary | ICD-10-CM

## 2017-03-30 DIAGNOSIS — M545 Low back pain, unspecified: Secondary | ICD-10-CM

## 2017-03-30 DIAGNOSIS — Z981 Arthrodesis status: Secondary | ICD-10-CM | POA: Diagnosis not present

## 2017-03-30 DIAGNOSIS — R262 Difficulty in walking, not elsewhere classified: Secondary | ICD-10-CM

## 2017-03-30 NOTE — Therapy (Signed)
[place], [place], [address] Phone: [phone]   Fax:  [phone]  Physical Therapy Treatment  Patient Details  Name: Rickey Fisher MRN: [id number] Date of Birth: 04-01-1976 Referring Provider: Marga Hoots , FNP  Encounter Date: 03/30/2017      PT End of Session - 03/30/17 1323    Visit Number 4   Number of Visits 4   Date for PT Re-Evaluation 04/22/17   PT Start Time 0815  short session due to patient late   PT Stop Time 0845   PT Time Calculation (min) 30 min   Equipment Utilized During Treatment Gait belt   Activity Tolerance Patient tolerated treatment well;No increased pain   Behavior During Therapy WFL for tasks assessed/performed      Past Medical History:  Diagnosis Date  . Cholelithiasis    CT abd 06/2010  . 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 Lumbar scoliosis 06/2010   noted on CT abdomen 06/2010. left convex lumbar     Past Surgical History:  Procedure Laterality Date  . COLON SURGERY    . COLOSTOMY     As a baby, does not know why\    There were no vitals filed for this visit.      Subjective Assessment - 03/30/17 0825    Subjective I take a pain pill every morning.  5/10. PT has helped me move around  better.  I can't do the group right now. i have to get the [place].     Currently in Pain? Yes   Pain Score 5    Pain Location Back   Pain Orientation Left   Pain Descriptors / Indicators Sore   Pain Type Chronic pain   Pain Frequency Constant   Aggravating Factors  Weather, cold, moving too quickly or the wrong way or if I overdo it.   Pain Relieving Factors meds,  rest   Multiple Pain Sites No            [facility] PT Assessment - 03/30/17 0001      Observation/Other Assessments   Focus on Therapeutic Outcomes (FOTO)  66 % limitation     Strength   Right Hip Flexion 4/5   Left Hip Flexion 4/5   Right Knee Flexion 4/5   Right Knee Extension 3+/5    Left Knee Flexion 3+/5   Left Knee Extension 4/5                     [facility] Adult PT Treatment/Exercise - 03/30/17 0001      Ambulation/Gait   Stairs Yes   Stairs Assistance --  rail/cane,  cues for cane placemenrt   Gait Comments on level,  Single point cand, gait belt,  200+ feet,  cues for cane placement and technique,  Steady and cautious,  walked around objects in PEDS gym and walked on soft mat.  CGA to close SBA     Lumbar Exercises: Aerobic   Stationary Bike L5 x 6 min UE/LE     Lumbar Exercises: Seated   Sit to Stand 5 reps     Knee/Hip Exercises: Standing   Forward Step Up Both;1 set;5 reps;Hand Hold: 2;Step Height: 6"   Forward Step Up Limitations HEP   Functional Squat 5 reps                PT Education - 03/30/17 1323    Education provided Yes   Education Details gait training.  Person(s) Educated Patient   Methods Explanation;Demonstration;Tactile cues;Verbal cues   Comprehension Verbalized understanding;Returned demonstration          PT Short Term Goals - 03/30/17 1327      PT SHORT TERM GOAL #1   Title Patient will be independent with basic HEP    Time 4   Period Weeks   Status Achieved     PT SHORT TERM GOAL #2   Title get self on off mat with good pace and min pain   Baseline slow   Time 4   Period Weeks   Status Partially Met     PT SHORT TERM GOAL #3   Title Pain will be decreased 25% or more in lower back   Time 4   Period Weeks   Status Achieved     PT SHORT TERM GOAL #4   Title He will be able to walk with SPC 200 feet  safely with supervision   Baseline able  in clinic   Time 4   Period Weeks   Status Achieved     PT SHORT TERM GOAL #5   Title He will be able to raise arms overhead without incr pain   Baseline pulling only,  ROM equal to other side (Slightly limited)   Time 4   Period Weeks   Status Achieved           PT Long Term Goals - 03/30/17 1336      PT LONG TERM GOAL #1   Title He will  be independent with all hEP issued   Baseline independent   Time 8   Period Weeks   Status Achieved     PT LONG TERM GOAL #2   Title He will report pain decreased 50% or more with mobility activity   Baseline improved to 5/10   Time 8   Period Weeks     PT LONG TERM GOAL #3   Title He will be able to  walk without device in home safely   Baseline walking with unilateral device   Time 8   Period Weeks   Status Unable to assess     PT LONG TERM GOAL #4   Title He will be able to   stand from chair without UE assist   Baseline used minimal UE in clinic from mat   Time 8   Period Weeks   Status Partially Met     PT LONG TERM GOAL #5   Title He will be able to   provide self care without assist and pain decr to 3-4/10   Baseline pain 5/10 with pain medds.    Time 8   Period Weeks   Status Unable to assess     PT LONG TERM GOAL #6   Title foto SCORE DECR TO 50% LIMITED OR LESS   Baseline pain decreased to 5/10 with pain meds today.  ( pain 8/10 on first visit.)   Time 8   Period Weeks   Status Partially Met               Plan - 03/30/17 1324    Clinical Impression Statement Last visit today. He cannot afford to pay for PTand  He cannot afford the group fee .  He desires to be discharged due to fiancial reasons.  His gait was decent with single point cane for endurance and safety,  STG#4 met., LTG #1 met, Partially Met: LTG#2, # 4, #6.  LTG#5 unable to  assess.He would have been able to meet more of his goals if he was able to  attend more PT.   PT Treatment/Interventions Moist Heat;Passive range of motion;Patient/family education;Manual techniques;Therapeutic exercise;Therapeutic activities;Dry needling;Balance training   PT Next Visit Plan Discharge  to HEP.   PT Home Exercise Plan bed mobility/ log rolling, seated marching, supine marching, LAQ, supine pelvic tilt and seated pelvic tilt, lower trunk rotation. added bridge, SLR flex and abduction and hamstring stretch     Consulted and Agree with Plan of Care Patient      Patient will benefit from skilled therapeutic intervention in order to improve the following deficits and impairments:  Decreased range of motion, Difficulty walking, Increased muscle spasms, Pain, Decreased activity tolerance, Decreased balance, Decreased strength, Postural dysfunction, Decreased mobility  Visit Diagnosis: History of lumbar spinal fusion  Muscle weakness (generalized)  Muscle spasm of back  Difficulty in walking, not elsewhere classified  Acute bilateral low back pain without sciatica     Problem List Patient Active Problem List   Diagnosis Date Noted  . Left lumbar radiculitis 09/07/2016  . Other idiopathic scoliosis, thoracolumbar region 07/13/2016  . Poor dentition 05/20/2016  . Onychomycosis of toenail 05/20/2016  . Tinea pedis 05/20/2016  . Anaphylactic shock due to adverse food reaction 05/20/2016  . Lumbar spine scoliosis 05/07/2016  . 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 Congenital anomaly of intestine 17-Aug-1976    Derril Franek PTA 03/30/2017, 3:06 PM  [facility] [address] [place], [place], [address] Phone: [phone]   Fax:  [phone]  Name: Rickey Fisher MRN: [id number] Date of Birth: 12/17/1975

## 2017-03-30 NOTE — Therapy (Addendum)
[place], [place], [address] Phone: [phone]   Fax:  [phone]  Physical Therapy Treatment  Patient Details  Name: Rowin Bayron MRN: [id number] Date of Birth: 04-01-1976 Referring Provider: Marga Hoots , FNP  Encounter Date: 03/30/2017      PT End of Session - 03/30/17 1323    Visit Number 4   Number of Visits 4   Date for PT Re-Evaluation 04/22/17   PT Start Time 0815  short session due to patient late   PT Stop Time 0845   PT Time Calculation (min) 30 min   Equipment Utilized During Treatment Gait belt   Activity Tolerance Patient tolerated treatment well;No increased pain   Behavior During Therapy WFL for tasks assessed/performed      Past Medical History:  Diagnosis Date  . Cholelithiasis    CT abd 06/2010  . 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 Lumbar scoliosis 06/2010   noted on CT abdomen 06/2010. left convex lumbar     Past Surgical History:  Procedure Laterality Date  . COLON SURGERY    . COLOSTOMY     As a baby, does not know why\    There were no vitals filed for this visit.      Subjective Assessment - 03/30/17 0825    Subjective I take a pain pill every morning.  5/10. PT has helped me move around  better.  I can't do the group right now. i have to get the [place].     Currently in Pain? Yes   Pain Score 5    Pain Location Back   Pain Orientation Left   Pain Descriptors / Indicators Sore   Pain Type Chronic pain   Pain Frequency Constant   Aggravating Factors  Weather, cold, moving too quickly or the wrong way or if I overdo it.   Pain Relieving Factors meds,  rest   Multiple Pain Sites No            [facility] PT Assessment - 03/30/17 0001      Observation/Other Assessments   Focus on Therapeutic Outcomes (FOTO)  66 % limitation     Strength   Right Hip Flexion 4/5   Left Hip Flexion 4/5   Right Knee Flexion 4/5   Right Knee Extension 3+/5    Left Knee Flexion 3+/5   Left Knee Extension 4/5                     [facility] Adult PT Treatment/Exercise - 03/30/17 0001      Ambulation/Gait   Stairs Yes   Stairs Assistance --  rail/cane,  cues for cane placemenrt   Gait Comments on level,  Single point cand, gait belt,  200+ feet,  cues for cane placement and technique,  Steady and cautious,  walked around objects in PEDS gym and walked on soft mat.  CGA to close SBA     Lumbar Exercises: Aerobic   Stationary Bike L5 x 6 min UE/LE     Lumbar Exercises: Seated   Sit to Stand 5 reps     Knee/Hip Exercises: Standing   Forward Step Up Both;1 set;5 reps;Hand Hold: 2;Step Height: 6"   Forward Step Up Limitations HEP   Functional Squat 5 reps                PT Education - 03/30/17 1323    Education provided Yes   Education Details gait training.  Person(s) Educated Patient   Methods Explanation;Demonstration;Tactile cues;Verbal cues   Comprehension Verbalized understanding;Returned demonstration          PT Short Term Goals - 03/30/17 1327      PT SHORT TERM GOAL #1   Title Patient will be independent with basic HEP    Time 4   Period Weeks   Status Achieved     PT SHORT TERM GOAL #2   Title get self on off mat with good pace and min pain   Baseline slow   Time 4   Period Weeks   Status Partially Met     PT SHORT TERM GOAL #3   Title Pain will be decreased 25% or more in lower back   Time 4   Period Weeks   Status Achieved     PT SHORT TERM GOAL #4   Title He will be able to walk with SPC 200 feet  safely with supervision   Baseline able  in clinic   Time 4   Period Weeks   Status Achieved     PT SHORT TERM GOAL #5   Title He will be able to raise arms overhead without incr pain   Baseline pulling only,  ROM equal to other side (Slightly limited)   Time 4   Period Weeks   Status Achieved           PT Long Term Goals - 03/30/17 1336      PT LONG TERM GOAL #1   Title He will  be independent with all hEP issued   Baseline independent   Time 8   Period Weeks   Status Achieved     PT LONG TERM GOAL #2   Title He will report pain decreased 50% or more with mobility activity   Baseline improved to 5/10   Time 8   Period Weeks     PT LONG TERM GOAL #3   Title He will be able to  walk without device in home safely   Baseline walking with unilateral device   Time 8   Period Weeks   Status Unable to assess     PT LONG TERM GOAL #4   Title He will be able to   stand from chair without UE assist   Baseline used minimal UE in clinic from mat   Time 8   Period Weeks   Status Partially Met     PT LONG TERM GOAL #5   Title He will be able to   provide self care without assist and pain decr to 3-4/10   Baseline pain 5/10 with pain medds.    Time 8   Period Weeks   Status Unable to assess     PT LONG TERM GOAL #6   Title foto SCORE DECR TO 50% LIMITED OR LESS   Baseline pain decreased to 5/10 with pain meds today.  ( pain 8/10 on first visit.)   Time 8   Period Weeks   Status Partially Met               Plan - 03/30/17 1324    Clinical Impression Statement Last visit today. He cannot afford to pay for PTand  He cannot afford the group fee .  He desires to be discharged due to fiancial reasons.  His gait was decent with single point cane for endurance and safety,  STG#4 met., LTG #1 met, Partially Met: LTG#2, # 4, #6.  LTG#5 unable to  assess.He would have been able to meet more of his goals if he was able to  attend more PT.   PT Treatment/Interventions Moist Heat;Passive range of motion;Patient/family education;Manual techniques;Therapeutic exercise;Therapeutic activities;Dry needling;Balance training   PT Next Visit Plan Discharge  to HEP.   PT Home Exercise Plan bed mobility/ log rolling, seated marching, supine marching, LAQ, supine pelvic tilt and seated pelvic tilt, lower trunk rotation. added bridge, SLR flex and abduction and hamstring stretch     Consulted and Agree with Plan of Care Patient      Patient will benefit from skilled therapeutic intervention in order to improve the following deficits and impairments:  Decreased range of motion, Difficulty walking, Increased muscle spasms, Pain, Decreased activity tolerance, Decreased balance, Decreased strength, Postural dysfunction, Decreased mobility  Visit Diagnosis: History of lumbar spinal fusion  Muscle weakness (generalized)  Muscle spasm of back  Difficulty in walking, not elsewhere classified  Acute bilateral low back pain without sciatica     Problem List Patient Active Problem List   Diagnosis Date Noted  . Left lumbar radiculitis 09/07/2016  . Other idiopathic scoliosis, thoracolumbar region 07/13/2016  . Poor dentition 05/20/2016  . Onychomycosis of toenail 05/20/2016  . Tinea pedis 05/20/2016  . Anaphylactic shock due to adverse food reaction 05/20/2016  . Lumbar spine scoliosis 05/07/2016  . 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 Congenital anomaly of intestine [birth year]14/1977    Darrel Gloss PTA 03/30/2017, 3:08 PM  [facility] [address] [place], [place], [address] Phone: [phone]   Fax:  [phone]  Name: Eros Montour MRN: [id number] Date of Birth: 23-Nov-1975   PHYSICAL THERAPY DISCHARGE SUMMARY  Visits from Start of Care:4  Current functional level related to goals / functional outcomes: See above    Remaining deficits: See above for most recent info   Education / Equipment: HEP, gait, posture, body mech Plan: Patient agrees to discharge.  Patient goals were partially met. Patient is being discharged due to financial reasons.  ?????    Raeford Razor, PT 06/07/17 8:13 AM Phone: [phone] Fax: [phone]

## 2017-06-01 ENCOUNTER — Telehealth: Payer: Self-pay | Admitting: Family Medicine

## 2017-06-01 NOTE — Telephone Encounter (Signed)
[facility] called requesting status of colostomy supplies for pt. They have faxed us a request since 10/04 with no response. Please f/up ph [phone] ex [phone]

## 2017-06-03 NOTE — Telephone Encounter (Signed)
MA returned the phone call. Patient has not been seen since 2017. Patient has a reestablishing appointment with [facility] on 06/25/17. Needs will be discussed then.

## 2017-06-07 ENCOUNTER — Other Ambulatory Visit: Payer: Self-pay | Admitting: Family Medicine

## 2017-06-27 ENCOUNTER — Ambulatory Visit: Payer: Self-pay | Admitting: Family Medicine

## 2017-06-28 ENCOUNTER — Ambulatory Visit: Payer: Self-pay | Admitting: Family Medicine

## 2017-07-13 ENCOUNTER — Encounter: Payer: Self-pay | Admitting: Family Medicine

## 2017-07-13 ENCOUNTER — Ambulatory Visit: Payer: Medicaid Other | Attending: Family Medicine | Admitting: Family Medicine

## 2017-07-13 VITALS — BP 142/88 | HR 92 | Temp 98.8°F | Resp 18 | Ht 74.0 in | Wt 147.0 lb

## 2017-07-13 DIAGNOSIS — M549 Dorsalgia, unspecified: Secondary | ICD-10-CM | POA: Insufficient documentation

## 2017-07-13 DIAGNOSIS — Z8739 Personal history of other diseases of the musculoskeletal system and connective tissue: Secondary | ICD-10-CM | POA: Diagnosis not present

## 2017-07-13 DIAGNOSIS — L608 Other nail disorders: Secondary | ICD-10-CM | POA: Insufficient documentation

## 2017-07-13 DIAGNOSIS — Z932 Ileostomy status: Secondary | ICD-10-CM | POA: Diagnosis not present

## 2017-07-13 DIAGNOSIS — M419 Scoliosis, unspecified: Secondary | ICD-10-CM | POA: Diagnosis present

## 2017-07-13 DIAGNOSIS — Z981 Arthrodesis status: Secondary | ICD-10-CM | POA: Insufficient documentation

## 2017-07-13 DIAGNOSIS — R531 Weakness: Secondary | ICD-10-CM

## 2017-07-13 MED ORDER — IBUPROFEN 600 MG PO TABS: 600.0000 mg | ORAL_TABLET | Freq: Three times a day (TID) | ORAL | 0 refills | Status: DC | PRN

## 2017-07-13 MED ORDER — GABAPENTIN 100 MG PO CAPS: 100.0000 mg | ORAL_CAPSULE | Freq: Three times a day (TID) | ORAL | 1 refills | Status: DC

## 2017-07-13 MED ORDER — DOCUSATE SODIUM 150 MG/15ML PO LIQD: 100.0000 mg | Freq: Every day | ORAL | 0 refills | Status: DC | PRN

## 2017-07-13 MED ORDER — OXYCODONE-ACETAMINOPHEN 10-300 MG PO TABS: 1.0000 | ORAL_TABLET | Freq: Four times a day (QID) | ORAL | 0 refills | Status: DC | PRN

## 2017-07-13 NOTE — Patient Instructions (Signed)

## 2017-07-13 NOTE — Progress Notes (Signed)
Subjective:  Patient ID: Rickey SextonCharles Fisher, male    DOB: 05/30/1976  Age: 41 y.o. MRN: [id number]  CC: Establish Care   HPI Rickey SextonCharles Stith presents to establish care.  Past medical history of congenital anormality of intestine, he has chronic ileostomy present.  History of idiopathic scoliosis of the thoracolumbar region with recent surgical intervention-lumbar spinal infusion.  He reports symptoms of weakness and lower back pain. He ambulates with the assistance of a front wheel walker.  He is unable to drive.  He reports only receiving 3 days of PT due to insurance.  He denies receiving any home health services.  He reports upcoming appointment with his specialist in February 2019.    Outpatient Medications Prior to Visit  Medication Sig Dispense Refill  . diazepam (VALIUM) 5 MG tablet Take 5 mg by mouth every 6 (six) hours as needed for anxiety.    . gabapentin (NEURONTIN) 100 MG capsule Take 1 capsule (100 mg total) by mouth 3 (three) times daily. 90 capsule 1  . oxycodone-acetaminophen (LYNOX) 10-300 MG tablet Take 1 tablet by mouth every 4 (four) hours as needed for pain.    . cyclobenzaprine (FLEXERIL) 10 MG tablet Take 1 tablet (10 mg total) by mouth at bedtime. 30 tablet 1  . EPINEPHrine 0.3 mg/0.3 mL IJ SOAJ injection Inject 0.3 mLs (0.3 mg total) into the muscle once as needed (anaphylaxis). 1 Device 2  . terbinafine (LAMISIL) 250 MG tablet Take 1 tablet (250 mg total) by mouth daily. 30 tablet 2  . traMADol (ULTRAM) 50 MG tablet Take 1 tablet (50 mg total) by mouth 3 (three) times daily as needed for moderate pain or severe pain. 90 tablet 1  . ibuprofen (ADVIL,MOTRIN) 600 MG tablet Take 1 tablet (600 mg total) by mouth every 8 (eight) hours as needed. 60 tablet 0   No facility-administered medications prior to visit.     ROS Review of Systems  Constitutional: Negative.   Respiratory: Negative.   Cardiovascular: Negative.   Gastrointestinal:       Right upper quadrant ileostomy.   Musculoskeletal: Positive for arthralgias, back pain, gait problem and myalgias.    Objective:  BP (!) 142/88 (BP Location: Left Arm, Patient Position: Sitting, Cuff Size: Normal)   Pulse 92   Temp 98.8 F (37.1 C) (Oral)   Resp 18   Ht 6\' 2"  (1.88 m)   Wt 147 lb (66.7 kg)   SpO2 98%   BMI 18.87 kg/m   BP/Weight 07/13/2017 10/28/2016 09/07/2016  Systolic BP 142 119 130  Diastolic BP 88 81 81  Wt. (Lbs) 147 - 150  BMI 18.87 - 19     Physical Exam  Constitutional: He appears well-developed and well-nourished.  Cardiovascular: Normal rate, regular rhythm, normal heart sounds and intact distal pulses.  Pulmonary/Chest: Effort normal and breath sounds normal.  Abdominal: Soft. Bowel sounds are normal. There is no tenderness.  Ileostomy to right upper quadrant.  Musculoskeletal:       Lumbar back: He exhibits pain (Chronic/history of scoliosis).  Bilateral leg weakness.  Skin: Skin is warm and dry.  Psychiatric: His speech is delayed. He expresses no homicidal and no suicidal ideation. He expresses no suicidal plans and no homicidal plans. He is communicative. He is attentive.  Nursing note and vitals reviewed.   Assessment & Plan:   1. Status post lumbar spinal fusion  Stool softener added to prevent constipation. - oxycodone-acetaminophen (LYNOX) 10-300 MG tablet; Take 1 tablet by mouth every 6 (six)  hours as needed for pain.  Dispense: 60 tablet; Refill: 0 - gabapentin (NEURONTIN) 100 MG capsule; Take 1 capsule (100 mg total) by mouth 3 (three) times daily.  Dispense: 90 capsule; Refill: 1 - ibuprofen (ADVIL,MOTRIN) 600 MG tablet; Take 1 tablet (600 mg total) by mouth every 8 (eight) hours as needed.  Dispense: 60 tablet; Refill: 0 - Docusate Sodium 150 MG/15ML syrup; Take 10 mLs (100 mg total) by mouth daily as needed (for moderate constipation).  Dispense: 473 mL; Refill: 0 - Ambulatory referral to Physical Therapy  2. History of scoliosis  - Ambulatory referral to  Physical Therapy  3. Weakness Ambulates with assistance of a walker could benefit from additional PT services. - Ambulatory referral to Physical Therapy      Follow-up: Return in about 2 months (around 09/13/2017) for Follow up.   Rickey BarkMandesia R Ronneisha Jett FNP

## 2017-07-13 NOTE — Progress Notes (Signed)
Patient is here for established care   Patient needs med refill

## 2017-08-10 ENCOUNTER — Ambulatory Visit: Payer: Medicaid Other | Attending: Family Medicine | Admitting: Physical Therapy

## 2017-08-10 ENCOUNTER — Encounter: Payer: Self-pay | Admitting: Physical Therapy

## 2017-08-10 DIAGNOSIS — M6281 Muscle weakness (generalized): Secondary | ICD-10-CM | POA: Diagnosis present

## 2017-08-10 DIAGNOSIS — Z981 Arthrodesis status: Secondary | ICD-10-CM | POA: Insufficient documentation

## 2017-08-10 DIAGNOSIS — R262 Difficulty in walking, not elsewhere classified: Secondary | ICD-10-CM | POA: Diagnosis present

## 2017-08-10 DIAGNOSIS — M545 Low back pain, unspecified: Secondary | ICD-10-CM

## 2017-08-10 DIAGNOSIS — M62830 Muscle spasm of back: Secondary | ICD-10-CM | POA: Insufficient documentation

## 2017-08-10 NOTE — Therapy (Signed)
[facility] [address] [place], [place], [address] Phone: [phone]   Fax:  [phone]  Physical Therapy Evaluation  Patient Details  Name: Rickey SextonCharles Killian MRN: [id number] Date of Birth: 01/01/1976 Referring Provider: Arrie SenateMandesia Hairston   Encounter Date: 08/10/2017  PT End of Session - 08/10/17 1449    Visit Number  1    Number of Visits  4    Date for PT Re-Evaluation  09/08/17    Authorization Type  Medicaid    PT Start Time  1400    PT Stop Time  1445    PT Time Calculation (min)  45 min    Activity Tolerance  Patient tolerated treatment well    Behavior During Therapy  [facility] for tasks assessed/performed       Past Medical History:  Diagnosis Date  . Cholelithiasis    CT abd 06/2010  . 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 Lumbar scoliosis 06/2010   noted on CT abdomen 06/2010. left convex lumbar     Past Surgical History:  Procedure Laterality Date  . COLON SURGERY    . COLOSTOMY     As a baby, does not know why\    There were no vitals filed for this visit.   Subjective Assessment - 08/10/17 1404    Subjective  Patient underwent spinal fusion surgery on May 11th 2018.  He had 4 PT visits in August, at the time that is all that Medicaid would allow.  He reports that he still uses the walker, he feels weak and does not walk well.  He reports still hurting    Limitations  Lifting;Walking    Patient Stated Goals  be stronger, walk better    Currently in Pain?  Yes    Pain Score  8     Pain Location  Back    Pain Orientation  Lower    Pain Descriptors / Indicators  Aching    Pain Type  Acute pain    Pain Onset  More than a month ago    Pain Frequency  Constant    Aggravating Factors   waliing, any movements, pain can be 9/10    Pain Relieving Factors  oxycontin, gabapentin rest, pain a 2/10    Effect of Pain on Daily Activities  reports really "can't do anything"         [facility]  Assessment - 08/10/17 0001      Assessment   Medical Diagnosis  S/P lumbar spinal fusion    Referring Provider  Arrie SenateMandesia Hairston    Onset Date/Surgical Date  12/17/16    Prior Therapy  4 visits after the surgery      Precautions   Precaution Comments  has a colostomy bag      Balance Screen   Has the patient fallen in the past 6 months  No    Has the patient had a decrease in activity level because of a fear of falling?   No    Is the patient reluctant to leave their home because of a fear of falling?   No      Home Environment   Additional Comments  2-3 steps into the home      Prior Function   Level of Independence  Independent with household mobility with device    Vocation  Unemployed    Vocation Requirements  working on disability, was working in 2017    Leisure  no  exercise      Posture/Postural Control   Posture Comments  very straight posture, gaurded sitting      ROM / Strength   AROM / PROM / Strength  AROM;Strength      AROM   Overall AROM Comments  Lumbar ROM decreased 100%      Strength   Overall Strength Comments  shoulders 3+/5, right LE 3+/5, left 4-/5      Flexibility   Soft Tissue Assessment /Muscle Length  yes    Hamstrings  very tight in HS and calves      Palpation   Palpation comment  tight and tender along the parapsinals      Transfers   Comments  has to use arms greatly to stand up      Ambulation/Gait   Gait Comments  uses a FWW, slow speed, head down, weight on hands, shuffles along      Standardized Balance Assessment   Standardized Balance Assessment  Timed Up and Go Test      Timed Up and Go Test   Normal TUG (seconds)  40             Objective measurements completed on examination: See above findings.                PT Short Term Goals - 08/10/17 1504      PT SHORT TERM GOAL #1   Title  Patient will be independent with basic HEP     Time  2    Period  Weeks    Status  New        PT Long Term  Goals - 08/10/17 1504      PT LONG TERM GOAL #1   Title  He will be independent with all hEP issued    Time  12    Period  Weeks    Status  New      PT LONG TERM GOAL #2   Title  He will report pain decreased 50% or more with mobility activity    Time  12    Period  Weeks    Status  New      PT LONG TERM GOAL #3   Title  He will be able to  walk without device in home safely    Time  12    Period  Weeks    Status  New      PT LONG TERM GOAL #4   Title  He will be able to   stand from chair without UE assist    Time  12    Period  Weeks    Status  New             Plan - 08/10/17 1451    Clinical Impression Statement  Patient underwent a lumbar fusion from T10-pelvis on 12/17/16.  He had 3 visits by PT in August, at the time that is all Medicaid would allow for patients.  He reports that he has remained in pain and has had difficulty walking, he is using the FWW, very slow with a lot of weight on his hands, shuffles feet, TUG time was 45 seconds, he really relies on his UE's to get up from sitting due to weakness    Clinical Presentation  Evolving    Clinical Presentation due to:  recent surgery    Clinical Decision Making  Low    Rehab Potential  Fair    PT Frequency  1x /  week    PT Duration  12 weeks    PT Treatment/Interventions  ADLs/Self Care Home Management;Cryotherapy;Electrical Stimulation;Moist Heat;Neuromuscular re-education;Balance training;Therapeutic exercise;Therapeutic activities;Functional mobility training;Patient/family education;Manual techniques    PT Next Visit Plan  slowly add exercises to advance strength and function, could educate on TENS for pain relief    Consulted and Agree with Plan of Care  Patient       Patient will benefit from skilled therapeutic intervention in order to improve the following deficits and impairments:  Abnormal gait, Cardiopulmonary status limiting activity, Decreased activity tolerance, Decreased balance, Decreased  mobility, Decreased strength, Impaired flexibility, Pain, Increased muscle spasms, Decreased range of motion, Difficulty walking  Visit Diagnosis: History of lumbar spinal fusion - Plan: PT plan of care cert/re-cert  Muscle weakness (generalized) - Plan: PT plan of care cert/re-cert  Difficulty in walking, not elsewhere classified - Plan: PT plan of care cert/re-cert  Muscle spasm of back - Plan: PT plan of care cert/re-cert  Acute bilateral low back pain without sciatica - Plan: PT plan of care cert/re-cert     Problem List Patient Active Problem List   Diagnosis Date Noted  . Weakness 07/13/2017  . Status post lumbar spinal fusion 07/13/2017  . Left lumbar radiculitis 09/07/2016  . Other idiopathic scoliosis, thoracolumbar region 07/13/2016  . Poor dentition 05/20/2016  . Onychomycosis of toenail 05/20/2016  . Tinea pedis 05/20/2016  . Anaphylactic shock due to adverse food reaction 05/20/2016  . Lumbar spine scoliosis 05/07/2016  . 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 Congenital anomaly of intestine 1976-02-13    Jearld Lesch., PT 08/10/2017, 3:07 PM  [facility] [address] [place], [place], [address] Phone: [phone]   Fax:  [phone]  Name: Tedric Leeth MRN: [id number] Date of Birth: 04-Feb-1976

## 2017-08-17 ENCOUNTER — Encounter: Payer: Self-pay | Admitting: Physical Therapy

## 2017-08-17 ENCOUNTER — Ambulatory Visit: Payer: Medicaid Other | Admitting: Physical Therapy

## 2017-08-17 DIAGNOSIS — Z981 Arthrodesis status: Secondary | ICD-10-CM | POA: Diagnosis not present

## 2017-08-17 DIAGNOSIS — R262 Difficulty in walking, not elsewhere classified: Secondary | ICD-10-CM

## 2017-08-17 DIAGNOSIS — M6281 Muscle weakness (generalized): Secondary | ICD-10-CM

## 2017-08-17 DIAGNOSIS — M62830 Muscle spasm of back: Secondary | ICD-10-CM

## 2017-08-17 NOTE — Therapy (Signed)
[facility] [address] [place], [place], [address] Phone: [phone]   Fax:  [phone]  Physical Therapy Treatment  Patient Details  Name: Rickey Fisher MRN: [id number] Date of Birth: [birth year] Referring Provider: Arrie Senate   Encounter Date: 08/17/2017  PT End of Session - 08/17/17 1437    Visit Number  2    Number of Visits  4    Date for PT Re-Evaluation  09/08/17    Authorization Type  Medicaid    PT Start Time  1352    PT Stop Time  1439    PT Time Calculation (min)  47 min    Activity Tolerance  Patient tolerated treatment well    Behavior During Therapy  [facility] for tasks assessed/performed       Past Medical History:  Diagnosis Date  . Cholelithiasis    CT abd 06/2010  . Congenital anomaly of intestine 25-Dec-1976  . Lumbar scoliosis 06/2010   noted on CT abdomen 06/2010. left convex lumbar     Past Surgical History:  Procedure Laterality Date  . COLON SURGERY    . COLOSTOMY     As a baby, does not know why\    There were no vitals filed for this visit.  Subjective Assessment - 08/17/17 1352    Subjective  Pt reports that he has been doing all right    Pain Score  8     Pain Location  Back                      [facility] Adult PT Treatment/Exercise - 08/17/17 0001      Ambulation/Gait   Ambulation/Gait  Yes    Ambulation Distance (Feet)  30 Feet    Assistive device  None    Gait Pattern  Step-through pattern;Decreased arm swing - right;Decreased arm swing - left;Decreased stride length    Ambulation Surface  Level;Indoor    Gait Comments  x2      Exercises   Exercises  Lumbar      Lumbar Exercises: Aerobic   Stationary Bike  NuStep L3 x6 min       Lumbar Exercises: Machines for Strengthening   Leg Press  20lb 2x10       Lumbar Exercises: Standing   Row  Both;15 reps;Theraband x2    Theraband Level (Row)  Level 3 (Green)    Other Standing Lumbar Exercises   standing march 2x10 with RW; 4in step ups in RW no UE support 2x5     Other Standing Lumbar Exercises  Standing HS curls 2x10 with RW               PT Short Term Goals - 08/10/17 1504      PT SHORT TERM GOAL #1   Title  Patient will be independent with basic HEP     Time  2    Period  Weeks    Status  New        PT Long Term Goals - 08/10/17 1504      PT LONG TERM GOAL #1   Title  He will be independent with all hEP issued    Time  12    Period  Weeks    Status  New      PT LONG TERM GOAL #2   Title  He will report pain decreased 50% or more with mobility activity    Time  12  Period  Weeks    Status  New      PT LONG TERM GOAL #3   Title  He will be able to  walk without device in home safely    Time  12    Period  Weeks    Status  New      PT LONG TERM GOAL #4   Title  He will be able to   stand from chair without UE assist    Time  12    Period  Weeks    Status  New            Plan - 08/17/17 1440    Clinical Impression Statement  Pt with heave use of UE when transferring from sit to stand. Pt able to perform off of today's exercises with some pain. Very slow ion leg press intimally but able to perform reps quicker as he progressed. Able to perform 4 inch step up without US support.     PT Frequency  1x / week    PT Duration  12 weeks    PT Treatment/Interventions  ADLs/Self Care Home Management;Cryotherapy;Electrical Stimulation;Moist Heat;Neuromuscular re-education;Balance training;Therapeutic exercise;Therapeutic activities;Functional mobility training;Patient/family education;Manual techniques    PT Next Visit Plan  slowly add exercises to advance strength and function, could educate on TENS for pain relief       Patient will benefit from skilled therapeutic intervention in order to improve the following deficits and impairments:  Abnormal gait, Cardiopulmonary status limiting activity, Decreased activity tolerance, Decreased balance,  Decreased mobility, Decreased strength, Impaired flexibility, Pain, Increased muscle spasms, Decreased range of motion, Difficulty walking  Visit Diagnosis: History of lumbar spinal fusion  Difficulty in walking, not elsewhere classified  Muscle weakness (generalized)  Muscle spasm of back     Problem List Patient Active Problem List   Diagnosis Date Noted  . Weakness 07/13/2017  . Status post lumbar spinal fusion 07/13/2017  . Left lumbar radiculitis 09/07/2016  . Other idiopathic scoliosis, thoracolumbar region 07/13/2016  . Poor dentition 05/20/2016  . Onychomycosis of toenail 05/20/2016  . Tinea pedis 05/20/2016  . Anaphylactic shock due to adverse food reaction 05/20/2016  . Lumbar spine scoliosis 05/07/2016  . 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 Congenital anomaly of intestine 1976/07/[birth year]    Grayce Sessionsonald G Nevan Creighton, PTA 08/17/2017, 2:48 PM  [facility] [address] [place], [place], [address] Phone: [phone]   Fax:  [phone]  Name: Rickey Fisher MRN: [id number] Date of Birth: 11/08/1975

## 2017-08-23 ENCOUNTER — Other Ambulatory Visit: Payer: Self-pay | Admitting: Family Medicine

## 2017-08-23 DIAGNOSIS — Z981 Arthrodesis status: Secondary | ICD-10-CM

## 2017-08-23 MED ORDER — OXYCODONE-ACETAMINOPHEN 10-300 MG PO TABS: 1.0000 | ORAL_TABLET | Freq: Four times a day (QID) | ORAL | 0 refills | Status: DC | PRN

## 2017-08-24 ENCOUNTER — Ambulatory Visit: Payer: Medicaid Other | Admitting: Physical Therapy

## 2017-08-29 ENCOUNTER — Encounter: Payer: Self-pay | Admitting: Pharmacist

## 2017-08-29 NOTE — Progress Notes (Signed)
PA submitted [place] [facility] [facility] [facility]. Approved x 180 days for acute use. Use beyond that will require justification by prescriber.

## 2017-08-31 ENCOUNTER — Ambulatory Visit: Payer: Medicaid Other | Admitting: Physical Therapy

## 2017-08-31 ENCOUNTER — Encounter: Payer: Self-pay | Admitting: Physical Therapy

## 2017-08-31 DIAGNOSIS — R262 Difficulty in walking, not elsewhere classified: Secondary | ICD-10-CM

## 2017-08-31 DIAGNOSIS — Z981 Arthrodesis status: Secondary | ICD-10-CM

## 2017-08-31 DIAGNOSIS — M6281 Muscle weakness (generalized): Secondary | ICD-10-CM

## 2017-08-31 NOTE — Therapy (Signed)
[place], [place], [address] Phone: [phone]   Fax:  [phone]  Physical Therapy Treatment  Patient Details  Name: Rickey Fisher MRN: [id number] Date of Birth: August 10, 1975 Referring Provider: Fredia Beets   Encounter Date: 08/31/2017  PT End of Session - 08/31/17 1525    Visit Number  3    Number of Visits  4    Date for PT Re-Evaluation  09/08/17    PT Start Time  7903    PT Stop Time  1523    PT Time Calculation (min)  51 min       Past Medical History:  Diagnosis Date  . Cholelithiasis    CT abd 06/2010  . Congenital anomaly of intestine 28-Dec-1976  . Lumbar scoliosis 06/2010   noted on CT abdomen 06/2010. left convex lumbar     Past Surgical History:  Procedure Laterality Date  . COLON SURGERY    . COLOSTOMY     As a baby, does not know why\    There were no vitals filed for this visit.  Subjective Assessment - 08/31/17 1432    Subjective  "All right"    Currently in Pain?  Yes    Pain Score  6     Pain Location  Back    Pain Orientation  Lower                      [facility] Adult PT Treatment/Exercise - 08/31/17 0001      Ambulation/Gait   Ambulation/Gait  Yes    Ambulation Distance (Feet)  120 Feet    Assistive device  None    Gait Pattern  Step-through pattern;Decreased arm swing - right;Decreased arm swing - left;Decreased stride length    Ambulation Surface  Level;Indoor      High Level Balance   High Level Balance Activities  Side stepping      Lumbar Exercises: Stretches   Passive Hamstring Stretch  Right;Left;4 reps;10 seconds    Single Knee to Chest Stretch  Left;Right;3 reps;10 seconds    Lower Trunk Rotation  3 reps;10 seconds    Piriformis Stretch  3 reps;Right;Left;10 seconds      Lumbar Exercises: Aerobic   Stationary Bike  NuStep L3 x6 min       Lumbar Exercises: Machines for Strengthening   Other Lumbar Machine Exercise   Rows & lats 20lb 2x10       Lumbar Exercises: Standing   Other Standing Lumbar Exercises  standing march 2x10 with RW; 4in step ups in RW no UE support 2x5       Lumbar Exercises: Seated   Sit to Stand  5 reps x2 airex on mat table.       Lumbar Exercises: Supine   Bridge  5 reps;Compliant some pain               PT Short Term Goals - 08/31/17 1531      PT SHORT TERM GOAL #1   Title  Patient will be independent with basic HEP     Status  Achieved        PT Long Term Goals - 08/31/17 1531      PT LONG TERM GOAL #2   Title  He will report pain decreased 50% or more with mobility activity    Status  On-going      PT LONG TERM GOAL #3   Title  He  will be able to  walk without device in home safely    Status  Partially Met            Plan - 08/31/17 1527    Clinical Impression Statement  Pt abe to handel additional interventions with increase intensities. Pt does ambulate slowly without RW and appears to move a little unsure. CGA with standing march to make pt feel a little more comfortable but not needed.     PT Frequency  1x / week    PT Duration  12 weeks    PT Treatment/Interventions  ADLs/Self Care Home Management;Cryotherapy;Electrical Stimulation;Moist Heat;Neuromuscular re-education;Balance training;Therapeutic exercise;Therapeutic activities;Functional mobility training;Patient/family education;Manual techniques    PT Next Visit Plan  slowly add exercises to advance strength and function, could educate on TENS for pain relief       Patient will benefit from skilled therapeutic intervention in order to improve the following deficits and impairments:  Abnormal gait, Cardiopulmonary status limiting activity, Decreased activity tolerance, Decreased balance, Decreased mobility, Decreased strength, Impaired flexibility, Pain, Increased muscle spasms, Decreased range of motion, Difficulty walking  Visit Diagnosis: History of lumbar spinal fusion  Difficulty  in walking, not elsewhere classified  Muscle weakness (generalized)     Problem List Patient Active Problem List   Diagnosis Date Noted  . Weakness 07/13/2017  . Status post lumbar spinal fusion 07/13/2017  . Left lumbar radiculitis 09/07/2016  . Other idiopathic scoliosis, thoracolumbar region 07/13/2016  . Poor dentition 05/20/2016  . Onychomycosis of toenail 05/20/2016  . Tinea pedis 05/20/2016  . Anaphylactic shock due to adverse food reaction 05/20/2016  . Lumbar spine scoliosis 05/07/2016  . Ileostomy present ([place]) 04/07/2012  . Congenital anomaly of intestine January 29, 1976    [name] 08/31/2017, 3:32 PM  [place] [place], [place], [address] Phone: [phone]   Fax:  [phone]  Name: Rickey Fisher MRN: [id number] Date of Birth: April 07, 1976

## 2017-09-07 ENCOUNTER — Ambulatory Visit: Payer: Medicaid Other | Admitting: Physical Therapy

## 2017-09-13 ENCOUNTER — Ambulatory Visit: Payer: Medicaid Other | Attending: Family Medicine | Admitting: Family Medicine

## 2017-09-13 ENCOUNTER — Encounter: Payer: Self-pay | Admitting: Family Medicine

## 2017-09-13 VITALS — BP 118/70 | HR 82 | Temp 98.1°F | Resp 18 | Ht 74.0 in | Wt 143.0 lb

## 2017-09-13 DIAGNOSIS — M4125 Other idiopathic scoliosis, thoracolumbar region: Secondary | ICD-10-CM | POA: Diagnosis not present

## 2017-09-13 DIAGNOSIS — G8929 Other chronic pain: Secondary | ICD-10-CM | POA: Insufficient documentation

## 2017-09-13 DIAGNOSIS — Z981 Arthrodesis status: Secondary | ICD-10-CM

## 2017-09-13 DIAGNOSIS — M549 Dorsalgia, unspecified: Secondary | ICD-10-CM | POA: Diagnosis not present

## 2017-09-13 DIAGNOSIS — Z09 Encounter for follow-up examination after completed treatment for conditions other than malignant neoplasm: Secondary | ICD-10-CM | POA: Diagnosis not present

## 2017-09-13 DIAGNOSIS — Z932 Ileostomy status: Secondary | ICD-10-CM | POA: Insufficient documentation

## 2017-09-13 DIAGNOSIS — Z79899 Other long term (current) drug therapy: Secondary | ICD-10-CM | POA: Insufficient documentation

## 2017-09-13 MED ORDER — IBUPROFEN 600 MG PO TABS: 600.0000 mg | ORAL_TABLET | Freq: Three times a day (TID) | ORAL | 0 refills | Status: DC | PRN

## 2017-09-13 MED ORDER — TRAMADOL HCL 50 MG PO TABS: 50.0000 mg | ORAL_TABLET | Freq: Three times a day (TID) | ORAL | 0 refills | Status: DC | PRN

## 2017-09-13 MED ORDER — CYCLOBENZAPRINE HCL 10 MG PO TABS: 10.0000 mg | ORAL_TABLET | Freq: Every evening | ORAL | 0 refills | Status: DC | PRN

## 2017-09-13 NOTE — Patient Instructions (Signed)

## 2017-09-13 NOTE — Progress Notes (Signed)
F/U  Stated Rx oxycodone-acetaminophen reaction: vomiting and stomach problems Back pain today scale #9

## 2017-09-13 NOTE — Progress Notes (Signed)
Subjective:  Patient ID: Rickey Fisher, male    DOB: 08/27/1975  Age: 42 y.o. MRN: [id number]  CC: Follow-up   HPI Rickey SextonCharles Moorman presents to establish care.  Past medical history of congenital anormality of intestine, he has chronic ileostomy present.  History of idiopathic scoliosis of the thoracolumbar region with recent surgical intervention-lumbar spinal infusion.  He reports symptoms of weakness and lower back pain. He ambulates with the assistance of a front wheel walker.  He is receiving PT and reports another session due tomorrow. He denies receiving any home health services.  He reports upcoming appointment with his specialist in a few weeks.    Outpatient Medications Prior to Visit  Medication Sig Dispense Refill  . diazepam (VALIUM) 5 MG tablet Take 5 mg by mouth every 6 (six) hours as needed for anxiety.    Marland Kitchen. EPINEPHrine 0.3 mg/0.3 mL IJ SOAJ injection Inject 0.3 mLs (0.3 mg total) into the muscle once as needed (anaphylaxis). 1 Device 2  . gabapentin (NEURONTIN) 100 MG capsule Take 1 capsule (100 mg total) by mouth 3 (three) times daily. 90 capsule 1  . Docusate Sodium 150 MG/15ML syrup Take 10 mLs (100 mg total) by mouth daily as needed (for moderate constipation). (Patient not taking: Reported on 09/13/2017) 473 mL 0  . oxycodone-acetaminophen (LYNOX) 10-300 MG tablet Take 1 tablet by mouth every 6 (six) hours as needed for pain. (Patient not taking: Reported on 09/13/2017) 60 tablet 0  . terbinafine (LAMISIL) 250 MG tablet Take 1 tablet (250 mg total) by mouth daily. (Patient not taking: Reported on 09/13/2017) 30 tablet 2  . cyclobenzaprine (FLEXERIL) 10 MG tablet Take 1 tablet (10 mg total) by mouth at bedtime. (Patient not taking: Reported on 09/13/2017) 30 tablet 1  . ibuprofen (ADVIL,MOTRIN) 600 MG tablet Take 1 tablet (600 mg total) by mouth every 8 (eight) hours as needed. (Patient not taking: Reported on 09/13/2017) 60 tablet 0  . traMADol (ULTRAM) 50 MG tablet Take 1 tablet (50 mg  total) by mouth 3 (three) times daily as needed for moderate pain or severe pain. (Patient not taking: Reported on 09/13/2017) 90 tablet 1   No facility-administered medications prior to visit.     Review of Systems  Constitutional: Negative.   Respiratory: Negative.   Cardiovascular: Negative.   Gastrointestinal:       Right upper quadrant ileostomy.  Musculoskeletal: Positive for back pain, joint pain and myalgias.      Objective:  BP 118/70 (BP Location: Right Arm, Patient Position: Sitting, Cuff Size: Normal)   Pulse 82   Temp 98.1 F (36.7 C) (Oral)   Resp 18   Ht 6\' 2"  (1.88 m)   Wt 143 lb (64.9 kg)   SpO2 99%   BMI 18.36 kg/m   BP/Weight 09/13/2017 07/13/2017 10/28/2016  Systolic BP 118 142 119  Diastolic BP 70 88 81  Wt. (Lbs) 143 147 -  BMI 18.36 18.87 -   Physical Exam  Nursing note and vitals reviewed. Constitutional: He is oriented to person, place, and time. He appears well-developed and well-nourished.  Cardiovascular: Normal rate, regular rhythm, normal heart sounds and intact distal pulses.  Respiratory: Effort normal and breath sounds normal.  GI: Soft. Bowel sounds are normal.  Ileostomy to right upper quadrant.  Musculoskeletal:       Lumbar back: He exhibits pain (chronic/history of scoliosis).  Neurological: He is alert and oriented to person, place, and time.  Reflex Scores:      Tricep  reflexes are 2+ on the right side and 2+ on the left side.      Bicep reflexes are 2+ on the right side and 2+ on the left side.      Brachioradialis reflexes are 2+ on the right side and 2+ on the left side.      Patellar reflexes are 2+ on the right side and 1+ on the left side. Skin: Skin is warm and dry.  Psychiatric: His speech is delayed. He expresses no homicidal and no suicidal ideation. He expresses no suicidal plans and no homicidal plans.    Assessment & Plan:   1. Follow up   2. Status post lumbar spinal fusion Follow up with specialist. -  traMADol (ULTRAM) 50 MG tablet; Take 1 tablet (50 mg total) by mouth every 8 (eight) hours as needed for moderate pain or severe pain.  Dispense: 40 tablet; Refill: 0 - ibuprofen (ADVIL,MOTRIN) 600 MG tablet; Take 1 tablet (600 mg total) by mouth every 8 (eight) hours as needed. Take with food.  Dispense: 60 tablet; Refill: 0 - cyclobenzaprine (FLEXERIL) 10 MG tablet; Take 1 tablet (10 mg total) by mouth at bedtime as needed for muscle spasms.  Dispense: 30 tablet; Refill: 0  3. Chronic back pain greater than 3 months duration  - ibuprofen (ADVIL,MOTRIN) 600 MG tablet; Take 1 tablet (600 mg total) by mouth every 8 (eight) hours as needed. Take with food.  Dispense: 60 tablet; Refill: 0 - cyclobenzaprine (FLEXERIL) 10 MG tablet; Take 1 tablet (10 mg total) by mouth at bedtime as needed for muscle spasms.  Dispense: 30 tablet; Refill: 0      Follow-up: Return As needed .   Lizbeth Bark FNP

## 2017-09-14 ENCOUNTER — Encounter: Payer: Self-pay | Admitting: Physical Therapy

## 2017-09-14 ENCOUNTER — Ambulatory Visit: Payer: Medicaid Other | Attending: Family Medicine | Admitting: Physical Therapy

## 2017-09-14 DIAGNOSIS — M6281 Muscle weakness (generalized): Secondary | ICD-10-CM | POA: Diagnosis present

## 2017-09-14 DIAGNOSIS — R262 Difficulty in walking, not elsewhere classified: Secondary | ICD-10-CM | POA: Diagnosis present

## 2017-09-14 DIAGNOSIS — Z981 Arthrodesis status: Secondary | ICD-10-CM | POA: Insufficient documentation

## 2017-09-14 NOTE — Therapy (Addendum)
[place], [place], [address] Phone: [phone]   Fax:  [phone]  Physical Therapy Treatment  Patient Details  Name: Rickey Fisher MRN: [id number] Date of Birth: 1976/05/31 Referring Provider: Fredia Beets   Encounter Date: 09/14/2017    Past Medical History:  Diagnosis Date  . Cholelithiasis    CT abd 06/2010  . 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 Lumbar scoliosis 06/2010   noted on CT abdomen 06/2010. left convex lumbar     Past Surgical History:  Procedure Laterality Date  . COLON SURGERY    . COLOSTOMY     As a baby, does not know why\    There were no vitals filed for this visit.  Subjective Assessment - 09/14/17 1433    Subjective  "I am in pain right now, I aint got no pain med's"    Currently in Pain?  Yes    Pain Score  7     Pain Location  Back                      [facility] Adult PT Treatment/Exercise - 09/14/17 0001      Lumbar Exercises: Aerobic   Stationary Bike  L0 x3 min     Elliptical  NuStep L4 x6 min     UBE (Upper Arm Bike)  L2 2 min frd       Lumbar Exercises: Machines for Strengthening   Cybex Knee Extension  5lb 2x10    Cybex Knee Flexion  20lb 2x10    Leg Press  20lb x10       Lumbar Exercises: Standing   Row  Both;15 reps;Theraband x2    Theraband Level (Row)  Level 4 (Blue)               PT Short Term Goals - 08/31/17 1531      PT SHORT TERM GOAL #1   Title  Patient will be independent with basic HEP     Status  Achieved        PT Long Term Goals - 09/14/17 1515      PT LONG TERM GOAL #1   Title  He will be independent with all hEP issued    Status  Achieved      PT LONG TERM GOAL #2   Title  He will report pain decreased 50% or more with mobility activity    Status  On-going      PT LONG TERM GOAL #3   Title  He will be able to  walk without device in home safely    Status  Partially Met       PT LONG TERM GOAL #4   Status  Partially Met            Plan - 09/14/17 1516    Clinical Impression Statement  Pt reported increase pain because he was out of pain med's. Pt enters with RW and is fully independent with RW. Ambulating without RW pt with a very slow and hesitant gait. Pt reports increase LBP when on leg press. Good motions with seated leg curs and extension. Pt able to stand and perform Tband rows without issue.     PT Frequency  1x / week    PT Duration  12 weeks    PT Treatment/Interventions  ADLs/Self Care Home Management;Cryotherapy;Electrical Stimulation;Moist Heat;Neuromuscular re-education;Balance training;Therapeutic exercise;Therapeutic activities;Functional mobility training;Patient/family education;Manual techniques  PT Next Visit Plan  slowly add exercises to advance strength and function, could educate on TENS for pain relief       Patient will benefit from skilled therapeutic intervention in order to improve the following deficits and impairments:  Abnormal gait, Cardiopulmonary status limiting activity, Decreased activity tolerance, Decreased balance, Decreased mobility, Decreased strength, Impaired flexibility, Pain, Increased muscle spasms, Decreased range of motion, Difficulty walking  Visit Diagnosis: History of lumbar spinal fusion  Difficulty in walking, not elsewhere classified  Muscle weakness (generalized)     Problem List Patient Active Problem List   Diagnosis Date Noted  . Weakness 07/13/2017  . Status post lumbar spinal fusion 07/13/2017  . Left lumbar radiculitis 09/07/2016  . Other idiopathic scoliosis, thoracolumbar region 07/13/2016  . Poor dentition 05/20/2016  . Onychomycosis of toenail 05/20/2016  . Tinea pedis 05/20/2016  . Anaphylactic shock due to adverse food reaction 05/20/2016  . Lumbar spine scoliosis 05/07/2016  . 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 Congenital anomaly of intestine July 22, 1976   PHYSICAL  THERAPY DISCHARGE SUMMARY  Visits from Start of Care: 3  Plan: Patient agrees to discharge.  Patient goals were not met. Patient is being discharged due to not returning since the last visit.  ?????      [name], PTA 09/14/2017, 3:21 PM  [place], [place], [address] Phone: [phone]   Fax:  [phone]  Name: Rickey Fisher MRN: [id number] Date of Birth: 1976-07-12

## 2017-09-16 NOTE — Addendum Note (Signed)
Addended by: Jearld LeschALBRIGHT, [place] W on: 09/16/2017 08:08 AM   Modules accepted: Orders

## 2017-09-28 ENCOUNTER — Ambulatory Visit: Payer: Medicaid Other | Admitting: Physical Therapy

## 2017-09-28 ENCOUNTER — Encounter: Payer: Self-pay | Admitting: Pharmacist

## 2017-09-28 NOTE — Progress Notes (Signed)
PA submitted and approved for tramadol. Approval #[id number]

## 2017-10-03 ENCOUNTER — Telehealth: Payer: Self-pay | Admitting: Family Medicine

## 2017-10-03 NOTE — Telephone Encounter (Signed)
Wrong authorization done its supposed to have been for a high dose

## 2017-10-03 NOTE — Telephone Encounter (Signed)
Previous PA voided, new one submitted. Not sure patient should need high dose approval - oxycodone was short course and patient should not need both at the same time. However, PA submitted, pending review by [place] [employer].

## 2017-10-05 ENCOUNTER — Ambulatory Visit: Payer: Medicaid Other | Admitting: Physical Therapy

## 2017-10-07 IMAGING — DX DG LUMBAR SPINE COMPLETE 4+V
5 series · 5 of 5 positions shown · non-contrast
Comparison: CT scan of the abdomen and pelvis dated 07/03/2010

CLINICAL DATA: Back pain for 3 days.

EXAM:
LUMBAR SPINE - COMPLETE 4+ VIEW

[l-spine ap]
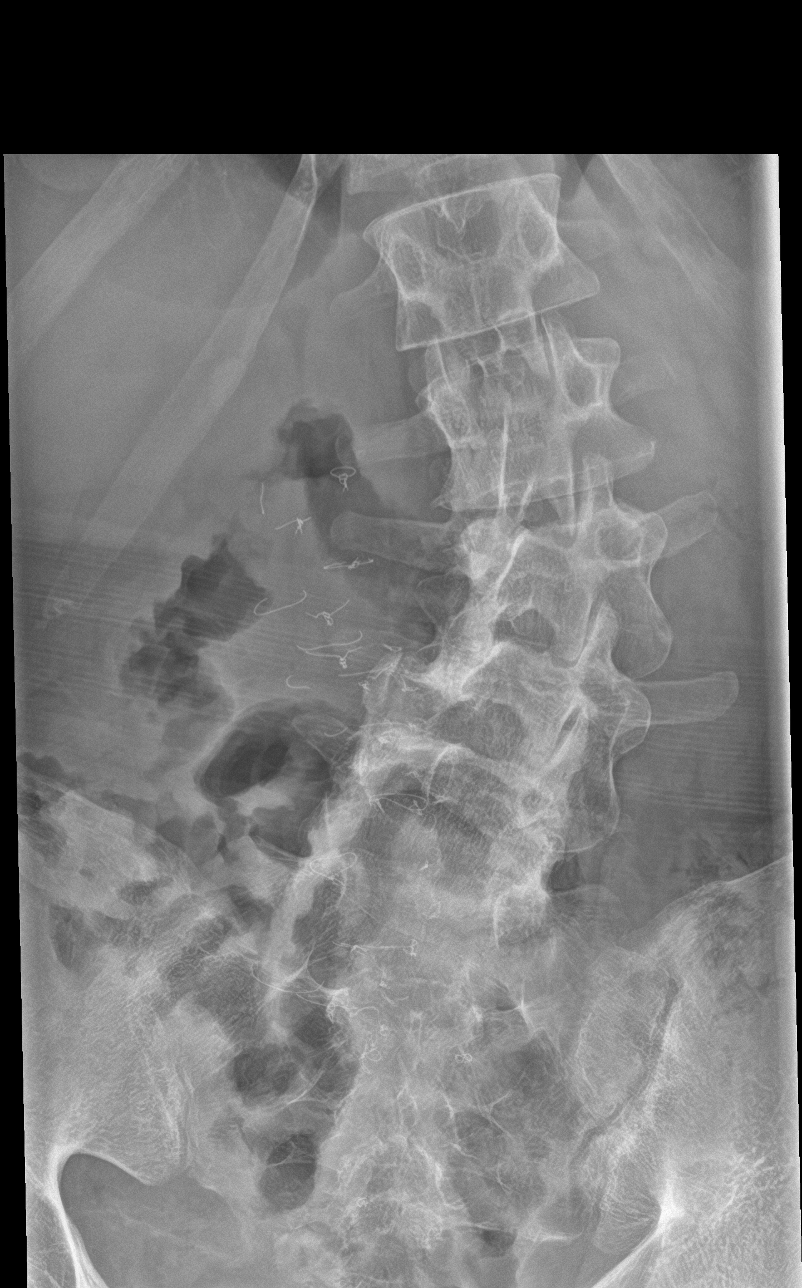

[l-spine obl (1 of 2)]
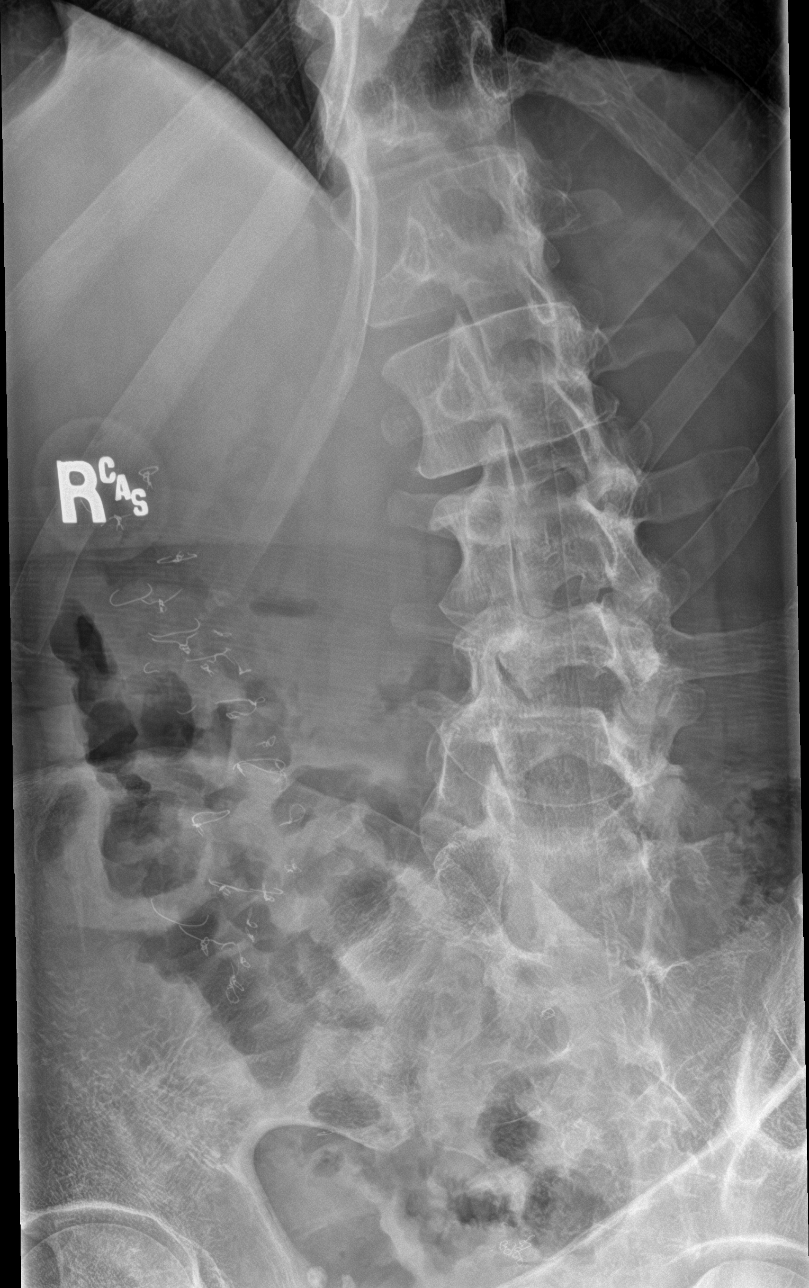

[l-spine obl (2 of 2)]
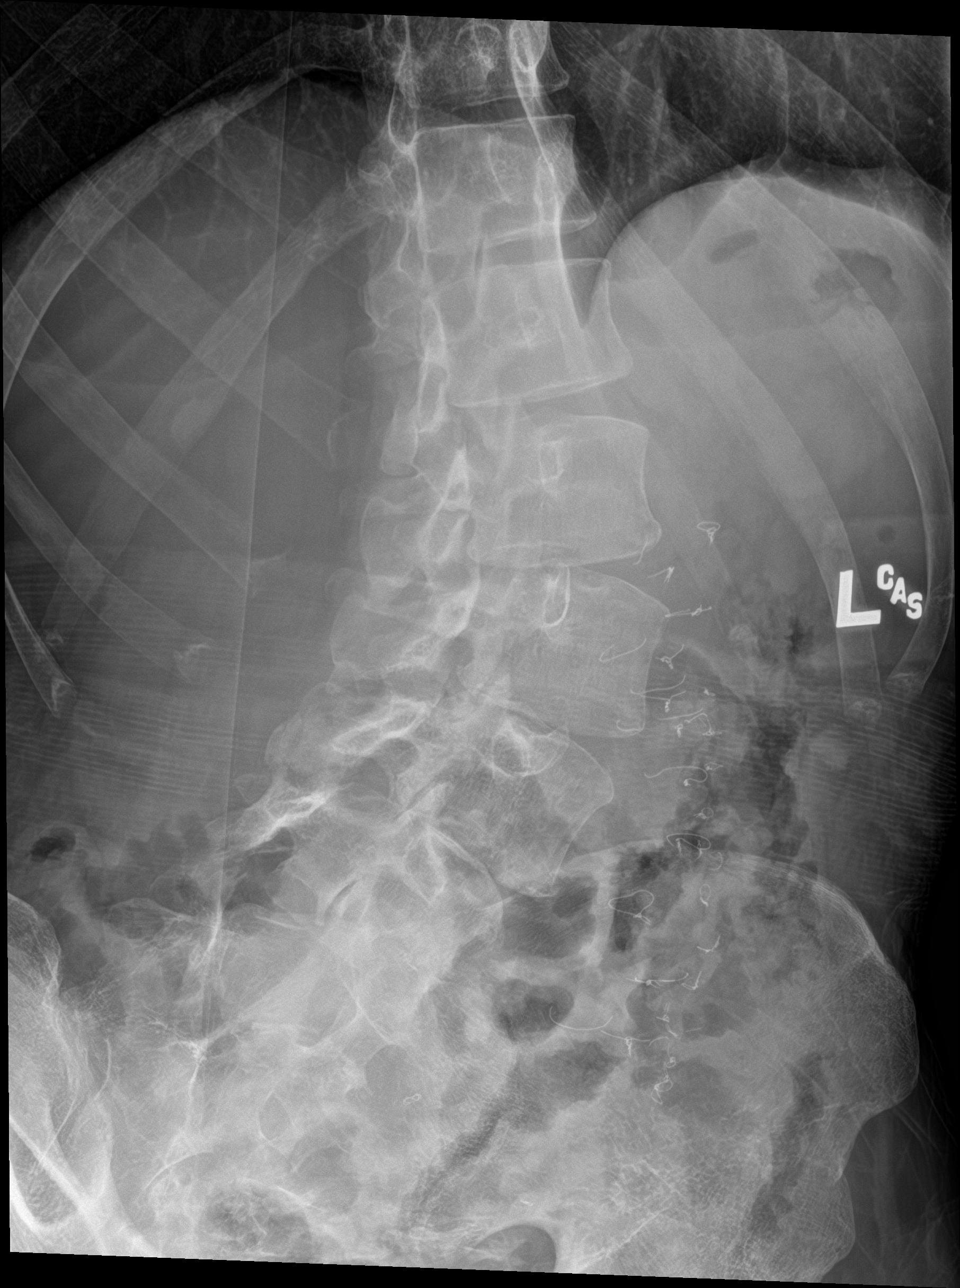

[l-spine lat]
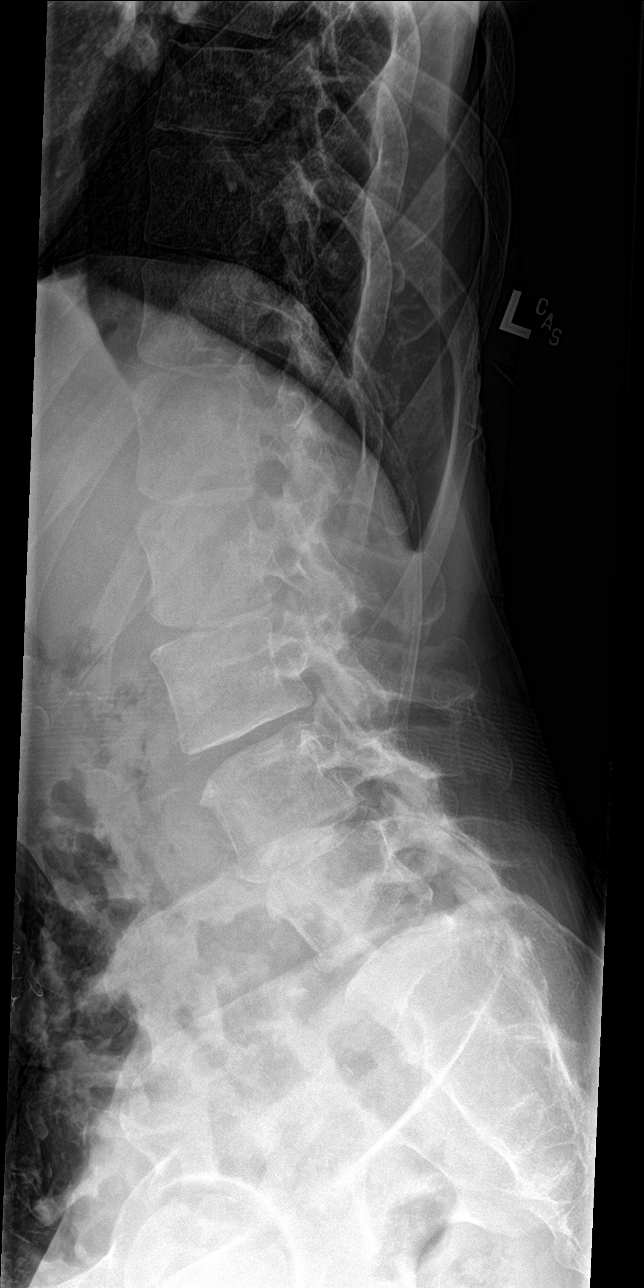

[l-spine spot]
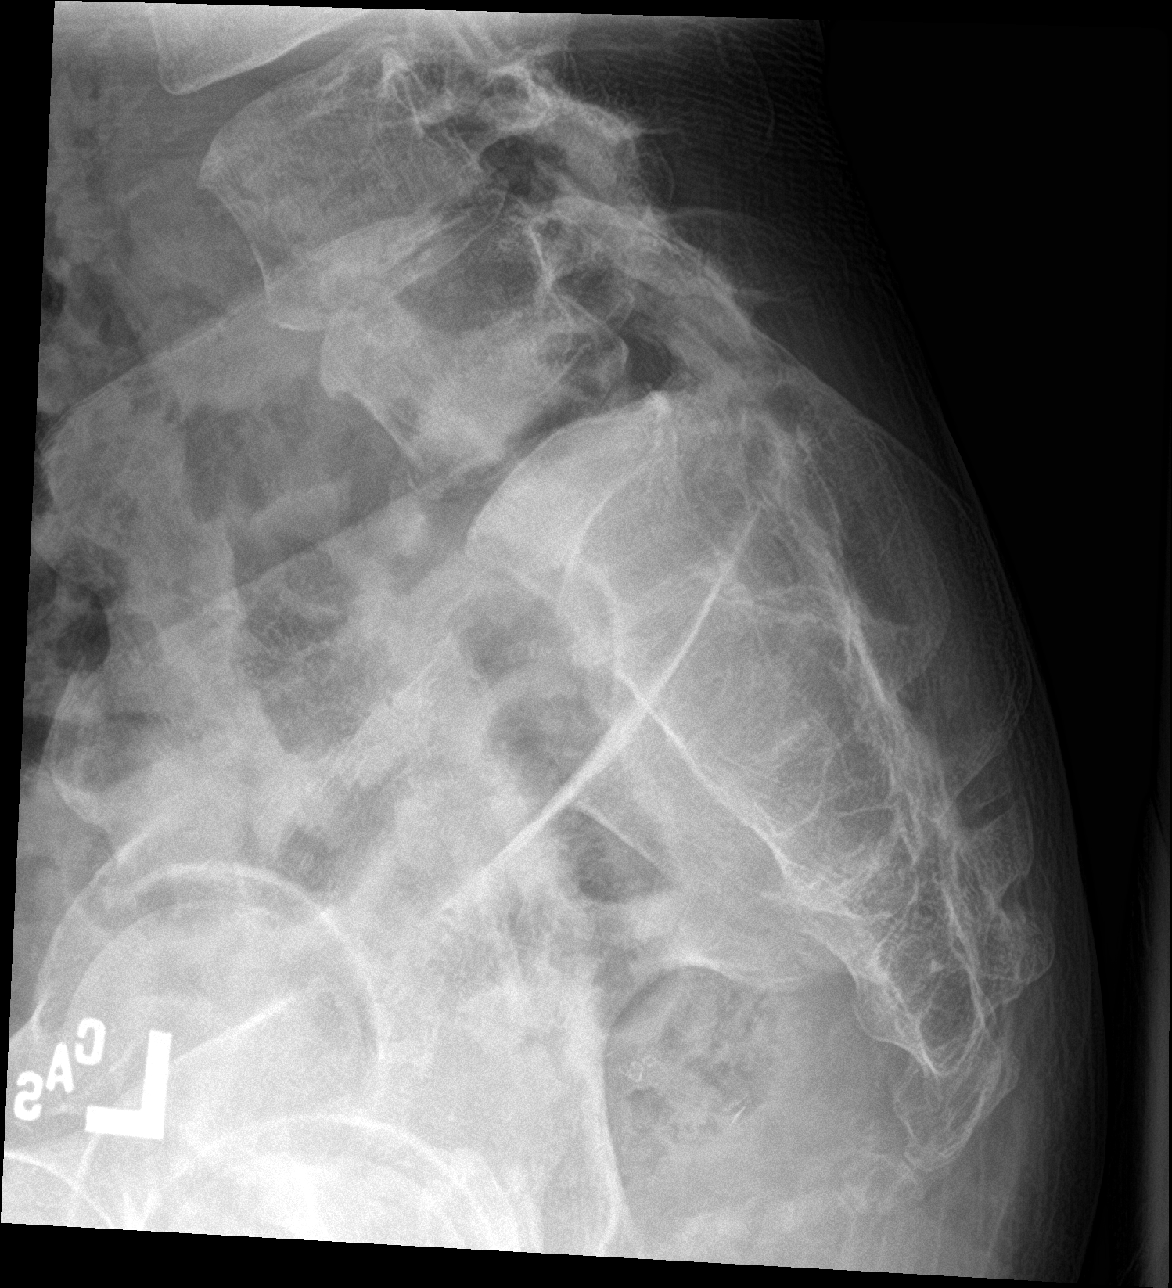

[5 of 5 positions shown; findings below may reference images not displayed]

FINDINGS: There is a chronic thoracolumbar scoliosis, unchanged since the
prior CT scan. Degenerative disc disease at L4-5. Slight facet
arthritis at L3-4 L4-5. No fracture or subluxation.
IMPRESSION: Chronic changes of the lumbar spine, stable. Stable thoracolumbar
scoliosis.

## 2018-04-19 ENCOUNTER — Ambulatory Visit: Payer: Medicaid Other | Admitting: Family Medicine

## 2018-05-04 ENCOUNTER — Ambulatory Visit: Payer: Medicaid Other | Admitting: Family Medicine

## 2018-05-26 ENCOUNTER — Ambulatory Visit: Payer: Medicaid Other | Admitting: Family Medicine

## 2018-05-26 ENCOUNTER — Encounter

## 2018-06-12 ENCOUNTER — Ambulatory Visit: Payer: Medicaid Other | Admitting: Family Medicine

## 2018-06-26 ENCOUNTER — Telehealth: Payer: Self-pay | Admitting: Family Medicine

## 2018-06-26 NOTE — Telephone Encounter (Signed)
Patient called stating that he cannot get his colostomy bag and that he needs the provider to fill out the paper work for it. Please call the patient back at [phone].

## 2018-06-27 ENCOUNTER — Ambulatory Visit: Payer: Medicaid Other | Admitting: Family Medicine

## 2018-06-27 NOTE — Telephone Encounter (Signed)
Patient has an appointment tomorrow 

## 2018-06-27 NOTE — Telephone Encounter (Signed)
Please schedule appointment for this patient to have an OV.

## 2018-06-27 NOTE — Telephone Encounter (Signed)
I have never seen patient as his provider.  Patient has missed multiple office visits.  Patient will need to come in for an appointment or to establish care with another provider to have his paperwork completed

## 2018-06-28 ENCOUNTER — Encounter: Payer: Self-pay | Admitting: Family Medicine

## 2018-06-28 ENCOUNTER — Ambulatory Visit: Payer: Medicaid Other | Attending: Family Medicine | Admitting: Family Medicine

## 2018-06-28 VITALS — BP 127/85 | HR 65 | Temp 98.5°F | Resp 16 | Ht 74.0 in | Wt 142.8 lb

## 2018-06-28 DIAGNOSIS — M549 Dorsalgia, unspecified: Secondary | ICD-10-CM

## 2018-06-28 DIAGNOSIS — Q459 Congenital malformation of digestive system, unspecified: Secondary | ICD-10-CM

## 2018-06-28 DIAGNOSIS — R200 Anesthesia of skin: Secondary | ICD-10-CM | POA: Diagnosis not present

## 2018-06-28 DIAGNOSIS — M6281 Muscle weakness (generalized): Secondary | ICD-10-CM

## 2018-06-28 DIAGNOSIS — Z791 Long term (current) use of non-steroidal anti-inflammatories (NSAID): Secondary | ICD-10-CM | POA: Insufficient documentation

## 2018-06-28 DIAGNOSIS — Z79899 Other long term (current) drug therapy: Secondary | ICD-10-CM

## 2018-06-28 DIAGNOSIS — G8929 Other chronic pain: Secondary | ICD-10-CM | POA: Diagnosis not present

## 2018-06-28 DIAGNOSIS — Q439 Congenital malformation of intestine, unspecified: Secondary | ICD-10-CM | POA: Diagnosis not present

## 2018-06-28 DIAGNOSIS — Z933 Colostomy status: Secondary | ICD-10-CM

## 2018-06-28 DIAGNOSIS — M545 Low back pain: Secondary | ICD-10-CM | POA: Insufficient documentation

## 2018-06-28 DIAGNOSIS — Z91018 Allergy to other foods: Secondary | ICD-10-CM | POA: Diagnosis not present

## 2018-06-28 DIAGNOSIS — M4185 Other forms of scoliosis, thoracolumbar region: Secondary | ICD-10-CM | POA: Insufficient documentation

## 2018-06-28 DIAGNOSIS — Z2821 Immunization not carried out because of patient refusal: Secondary | ICD-10-CM | POA: Diagnosis not present

## 2018-06-28 DIAGNOSIS — R269 Unspecified abnormalities of gait and mobility: Secondary | ICD-10-CM

## 2018-06-28 DIAGNOSIS — M79604 Pain in right leg: Secondary | ICD-10-CM | POA: Diagnosis not present

## 2018-06-28 DIAGNOSIS — M62838 Other muscle spasm: Secondary | ICD-10-CM | POA: Insufficient documentation

## 2018-06-28 DIAGNOSIS — M25559 Pain in unspecified hip: Secondary | ICD-10-CM | POA: Diagnosis not present

## 2018-06-28 DIAGNOSIS — Z13220 Encounter for screening for lipoid disorders: Secondary | ICD-10-CM | POA: Diagnosis not present

## 2018-06-28 DIAGNOSIS — F17210 Nicotine dependence, cigarettes, uncomplicated: Secondary | ICD-10-CM | POA: Diagnosis not present

## 2018-06-28 DIAGNOSIS — Z981 Arthrodesis status: Secondary | ICD-10-CM

## 2018-06-28 MED ORDER — EPINEPHRINE 0.3 MG/0.3ML IJ SOAJ: 0.3000 mg | Freq: Once | INTRAMUSCULAR | 11 refills | Status: DC | PRN

## 2018-06-28 MED ORDER — TRAMADOL HCL 50 MG PO TABS: 50.0000 mg | ORAL_TABLET | Freq: Three times a day (TID) | ORAL | 0 refills | Status: DC | PRN

## 2018-06-28 NOTE — Progress Notes (Signed)
Subjective:    Patient ID: Rickey SextonCharles Fisher, male    DOB: 10/18/1975, 42 y.o.   MRN: [id number]  HPI 42 year old male, new to me as a patient, who presents to reestablish care.  Patient was last seen in the office on 10/03/2017 for his initial visit to establish care.  Patient reports that he has had a colostomy since he was born with a congenital intestinal abnormality.  Patient states that he does not know anything else about his condition.  Patient also with a history of scoliosis of the thoracolumbar spine for which she has had prior surgery.  Patient states that he continues to have profound weakness in his lower legs and patient uses a front wheeled walker for ambulation.  Patient is attending physical therapy.  Patient reports that his lower back pain continues to be about an 8 on a 0-to-10 scale.  Patient states that the pain often radiates down his right leg.  Patient describes the pain as aching, throbbing and occasionally sharp.  Patient requests a new prescription for tramadol which he received to this office at his last visit.  Patient reports that he continues to attend physical therapy.  Patient has also seen pain management to receive injections to help with his back pain but these wear off after a while.      Patient reports that he has been trying to obtain new colostomy supplies but has been unable to do so.  Patient states that he is having to reuse the same colostomy bag.  Patient does get occasional leakage from the colostomy site.  Patient has not noticed any change in color, odor or consistency of fecal material.  Patient denies any current abdominal pain, no fever or chills.  Patient denies any nausea.  Patient states that a lot of his low back pain is actually internal as he also has numbness over his lower back and hip area.  Patient reports that he has never had any GI evaluation regarding his ileostomy/colostomy.  Patient does not know anything about his intestinal condition.  Past  Medical History:  Diagnosis Date  . Cholelithiasis    CT abd 06/2010  . Congenital anomaly of intestine 1975-08-17  . Lumbar scoliosis 06/2010   noted on CT abdomen 06/2010. left convex lumbar    Past Surgical History:  Procedure Laterality Date  . COLON SURGERY    . COLOSTOMY     As a baby, does not know why\   Family History  Problem Relation Age of Onset  . Lupus Father   . Hypertension Mother    Family History  Problem Relation Age of Onset  . Lupus Father   . Hypertension Mother    Social History   Tobacco Use  . Smoking status: Current Every Day Smoker    Packs/day: 0.10    Types: Cigarettes  . Smokeless tobacco: Never Used  Substance Use Topics  . Alcohol use: No  . Drug use: Yes    Types: Marijuana   Allergies  Allergen Reactions  . Banana Anaphylaxis  . Shellfish Allergy Anaphylaxis  . Tomato Anaphylaxis  . Pollen Extract Itching    Current Outpatient Medications:  .  cyclobenzaprine (FLEXERIL) 10 MG tablet, Take 1 tablet (10 mg total) by mouth at bedtime as needed for muscle spasms., Disp: 30 tablet, Rfl: 0 .  diazepam (VALIUM) 5 MG tablet, Take 5 mg by mouth every 6 (six) hours as needed for anxiety., Disp: , Rfl:  .  Docusate Sodium 150 MG/15ML  syrup, Take 10 mLs (100 mg total) by mouth daily as needed (for moderate constipation). (Patient not taking: Reported on 09/13/2017), Disp: 473 mL, Rfl: 0 .  EPINEPHrine 0.3 mg/0.3 mL IJ SOAJ injection, Inject 0.3 mLs (0.3 mg total) into the muscle once as needed for up to 1 dose (anaphylaxis)., Disp: 1 Device, Rfl: 11 .  gabapentin (NEURONTIN) 100 MG capsule, Take 1 capsule (100 mg total) by mouth 3 (three) times daily., Disp: 90 capsule, Rfl: 1 .  ibuprofen (ADVIL,MOTRIN) 600 MG tablet, Take 1 tablet (600 mg total) by mouth every 8 (eight) hours as needed. Take with food. (Patient not taking: Reported on 06/28/2018), Disp: 60 tablet, Rfl: 0 .  oxycodone-acetaminophen (LYNOX) 10-300 MG tablet, Take 1 tablet by  mouth every 6 (six) hours as needed for pain. (Patient not taking: Reported on 09/13/2017), Disp: 60 tablet, Rfl: 0 .  terbinafine (LAMISIL) 250 MG tablet, Take 1 tablet (250 mg total) by mouth daily. (Patient not taking: Reported on 09/13/2017), Disp: 30 tablet, Rfl: 2 .  traMADol (ULTRAM) 50 MG tablet, Take 1 tablet (50 mg total) by mouth every 8 (eight) hours as needed for moderate pain or severe pain., Disp: 40 tablet, Rfl: 0    Review of Systems  Constitutional: Positive for fatigue. Negative for chills and fever.  Respiratory: Negative for cough and shortness of breath.   Cardiovascular: Negative for chest pain, palpitations and leg swelling.  Gastrointestinal: Negative for abdominal pain, blood in stool, constipation, diarrhea and nausea.  Genitourinary: Negative for dysuria and frequency.  Musculoskeletal: Positive for arthralgias, back pain, gait problem and myalgias. Negative for joint swelling.  Allergic/Immunologic: Positive for food allergies. Negative for environmental allergies.  Neurological: Positive for numbness. Negative for dizziness, light-headedness and headaches.       Objective:   Physical Exam BP 127/85   Pulse 65   Temp 98.5 F (36.9 C) (Oral)   Resp 16   Ht 6\' 2"  (1.88 m)   Wt 142 lb 12.8 oz (64.8 kg)   SpO2 98%   BMI 18.33 kg/m Nurse's notes and vital signs reviewed General- thin framed male who appears younger than stated age.  Patient initially sitting in a chair in exam room with his walker in front of him.  Patient uses his arms to push himself into a standing position during exam. Lungs-clear to auscultation bilaterally Cardiovascular-regular rate and rhythm Abdomen- soft, nontender, patient has a right medial mid abdominal colostomy.  Patient does have some leakage of fecal material on the abdomen near the colostomy site.  Patient does not have any tenderness of the abdomen.  Patient does have pronounced erythema edema of the mucosa at the ostomy  site Back-no CVA tenderness.  Patient with old, healed surgical scar midline lower thoracic to lumbosacral area.  Patient with bilateral thoracolumbar muscle spasm.  Patient with muscle wasting in the lower back and lower extremities Extremities- no peripheral edema        Assessment & Plan:  1. Congenital anomaly of intestine Patient reports a congenital anomaly of his intestines which required creation of colostomy/ileostomy shortly after birth.  Unfortunately there are no medical records available regarding this issue and patient states that he has never been evaluated by gastroenterology for his intestinal issues.  Patient will have CMP and CBC at today's visit.  Patient is very thin and will see if there are any nutritional deficiencies/low protein or albuminuria related to his intestinal abnormality as patient may be having issues with malabsorption.  We  will also check CBC for any issues with blood loss. - Ambulatory referral to Gastroenterology - Comprehensive metabolic panel - CBC with Differential  2. Colostomy in place [facility]) Patient's ostomy supply company, [employer], will be sent new order by fax for ostomy supplies.  Patient also gave phone number of [phone] for his supply company.  Patient is also being referred to gastroenterology for further evaluation of his history of congenital intestinal abnormalities requiring creation of ileostomy/colostomy - Ambulatory referral to Gastroenterology  3. Screening for lipid disorders Patient will have screening for lipid disorder at today's visit as he is currently fasting. - Lipid panel  4. Chronic back pain greater than 3 months duration Patient has had chronic low back pain secondary to scoliosis and then after spinal fusion for treatment of scoliosis.  Patient is provided with prescription per his request for tramadol which he was prescribed at his last visit here in February.  Patient does attend pain management and patient's  last pain management note indicates that he was treated with injections. - traMADol (ULTRAM) 50 MG tablet; Take 1 tablet (50 mg total) by mouth every 8 (eight) hours as needed for moderate pain or severe pain.  Dispense: 40 tablet; Refill: 0  5. Status post lumbar spinal fusion Patient with complaint of chronic low back and hip area pain status post lumbar fusion surgery.  New prescription provided for tramadol.  Patient reports that he does not get any pain medication from his pain management doctor but only receives injections. - traMADol (ULTRAM) 50 MG tablet; Take 1 tablet (50 mg total) by mouth every 8 (eight) hours as needed for moderate pain or severe pain.  Dispense: 40 tablet; Refill: 0  6. Encounter for long-term (current) use of medications Patient will have CMP, CBC and lipid panel in follow-up of long-term use of medications for the treatment of muscle spasms, chronic low back pain - Comprehensive metabolic panel - CBC with Differential - Lipid panel  7.  Muscle weakness Patient's most recent physical therapy notes reviewed.  Patient will continue follow-up with physical therapy as patient is mostly dependent on upper extremity strength in order to use his wheeled walker for mobility.  8.  Gait abnormality Patient with gait abnormality due to lower extremity weakness status post spinal fusion surgery for scoliosis.  Patient currently requires wheeled walker for ambulation  9.  Multiple food allergies Patient with multiple food allergies and new prescription sent to patient's pharmacy for EpiPen to use if needed  *Patient was offered influenza immunization at today's visit which he declined  An After Visit Summary was printed and given to the patient.  Return in about 6 months (around 12/27/2018) for 6 mos and as needed.

## 2018-06-29 LAB — CBC WITH DIFFERENTIAL/PLATELET
Basophils Absolute: 0 x10E3/uL (ref 0.0–0.2)
Basos: 0 %
EOS (ABSOLUTE): 0.1 x10E3/uL (ref 0.0–0.4)
Eos: 2 %
Hematocrit: 43.9 % (ref 37.5–51.0)
Hemoglobin: 13.7 g/dL (ref 13.0–17.7)
Immature Grans (Abs): 0 x10E3/uL (ref 0.0–0.1)
Immature Granulocytes: 0 %
Lymphocytes Absolute: 1.7 x10E3/uL (ref 0.7–3.1)
Lymphs: 20 %
MCH: 25.3 pg — ABNORMAL LOW (ref 26.6–33.0)
MCHC: 31.2 g/dL — ABNORMAL LOW (ref 31.5–35.7)
MCV: 81 fL (ref 79–97)
Monocytes Absolute: 0.7 x10E3/uL (ref 0.1–0.9)
Monocytes: 8 %
Neutrophils Absolute: 6 x10E3/uL (ref 1.4–7.0)
Neutrophils: 70 %
Platelets: 236 x10E3/uL (ref 150–450)
RBC: 5.42 x10E6/uL (ref 4.14–5.80)
RDW: 12.9 % (ref 12.3–15.4)
WBC: 8.7 x10E3/uL (ref 3.4–10.8)

## 2018-06-29 LAB — COMPREHENSIVE METABOLIC PANEL WITH GFR
ALT: 29 IU/L (ref 0–44)
AST: 29 IU/L (ref 0–40)
Albumin/Globulin Ratio: 2.2 (ref 1.2–2.2)
Albumin: 4.8 g/dL (ref 3.5–5.5)
Alkaline Phosphatase: 61 IU/L (ref 39–117)
BUN/Creatinine Ratio: 6 — ABNORMAL LOW (ref 9–20)
BUN: 7 mg/dL (ref 6–24)
Bilirubin Total: 0.9 mg/dL (ref 0.0–1.2)
CO2: 21 mmol/L (ref 20–29)
Calcium: 9.3 mg/dL (ref 8.7–10.2)
Chloride: 103 mmol/L (ref 96–106)
Creatinine, Ser: 1.12 mg/dL (ref 0.76–1.27)
GFR calc Af Amer: 94 mL/min/1.73
GFR calc non Af Amer: 81 mL/min/1.73
Globulin, Total: 2.2 g/dL (ref 1.5–4.5)
Glucose: 73 mg/dL (ref 65–99)
Potassium: 3.8 mmol/L (ref 3.5–5.2)
Sodium: 139 mmol/L (ref 134–144)
Total Protein: 7 g/dL (ref 6.0–8.5)

## 2018-06-29 LAB — LIPID PANEL
Chol/HDL Ratio: 1.8 ratio (ref 0.0–5.0)
Cholesterol, Total: 139 mg/dL (ref 100–199)
HDL: 78 mg/dL
LDL Calculated: 49 mg/dL (ref 0–99)
Triglycerides: 61 mg/dL (ref 0–149)
VLDL Cholesterol Cal: 12 mg/dL (ref 5–40)

## 2018-06-29 NOTE — Telephone Encounter (Signed)
Patient verified DOB Patient is aware of labs being normal. No further questions. Patient inquired about blood type and was advised to contact where he was born.

## 2018-06-29 NOTE — Telephone Encounter (Signed)
-----   Message from Cain Saupeammie Fulp, MD sent at 06/29/2018  8:39 AM EST ----- Please notify patient that his CMP, CBC and lipid panel were normal

## 2018-07-11 ENCOUNTER — Telehealth: Payer: Self-pay

## 2018-07-11 NOTE — Telephone Encounter (Signed)
[facility]

## 2018-07-11 NOTE — Telephone Encounter (Signed)
Call placed to the patient to confirm if has colostomy v. Ileostomy.  His medical record refers to both.   The patient stated that he has a colostomy.

## 2018-07-25 ENCOUNTER — Other Ambulatory Visit: Payer: Self-pay | Admitting: Gastroenterology

## 2018-07-25 DIAGNOSIS — Z8719 Personal history of other diseases of the digestive system: Secondary | ICD-10-CM

## 2018-07-31 ENCOUNTER — Ambulatory Visit
Admission: RE | Admit: 2018-07-31 | Discharge: 2018-07-31 | Disposition: A | Discharge status: Home / Self Care | Payer: Medicaid Other | Source: Ambulatory Visit | Attending: Gastroenterology | Admitting: Gastroenterology

## 2018-07-31 DIAGNOSIS — Z8719 Personal history of other diseases of the digestive system: Secondary | ICD-10-CM

## 2018-09-19 ENCOUNTER — Telehealth: Payer: Self-pay | Admitting: Family Medicine

## 2018-09-19 NOTE — Telephone Encounter (Signed)
Patient called because he would like a letter from PCP stating why he is unable to work in order to help with his food stamp application. Please follow up.

## 2018-09-19 NOTE — Telephone Encounter (Signed)
Is there a specific template for this letter?

## 2018-09-19 NOTE — Telephone Encounter (Signed)
Patient request and unable to maintain gainful employment letter to present to DSS for food stamps

## 2018-09-20 NOTE — Telephone Encounter (Signed)
General to blank and type a free text letter.

## 2018-10-01 ENCOUNTER — Other Ambulatory Visit: Payer: Self-pay | Admitting: Family Medicine

## 2018-10-01 NOTE — Telephone Encounter (Signed)
I am unable to figure out how to write the letter that patient has requested for documentation through the format of this encounter and was also unable to create via an encounter note therefore I will ask for assistance for a staff member tomorrow during clinic hours so that patients request can be completed.

## 2018-10-01 NOTE — Progress Notes (Signed)
Patient ID: Rickey Fisher, male   DOB: [birth year], 43 y.o.   MRN: [id number]   Patient called to request letter for DSS that he is unable to obtain gainful employment due to his medical conditions.  Letter will be written for the patient as patient has significant debility secondary to history of scoliosis with failed back surgery syndrome  status post T10 through pelvis posterior spinal fusion, L3-S1 TLIF (translaminar lumbar interbody fusion) and patient with chronic low back and chronic right hip pain as a result.  Patient requires rolling walker for ambulation.  Patient also with congenital intestinal issues requiring colostomy.  Unfortunately I am unsure how to write note for the latter as part of an order set therefore I will may need to open a second note.

## 2018-10-03 NOTE — Telephone Encounter (Signed)
See patient phone calls-letter prepared for pick-up by patient regarding his inability to seek or gain employment at this time.

## 2018-10-03 NOTE — Telephone Encounter (Signed)
Letter prepared. Please call patient for pick-up and leave letter for patient at front desk

## 2018-10-05 ENCOUNTER — Ambulatory Visit: Payer: Medicaid Other | Admitting: Family Medicine

## 2018-10-09 NOTE — Telephone Encounter (Signed)
Please let patient know letter has been placed at the front desk for pick up.

## 2018-11-02 ENCOUNTER — Encounter: Payer: Self-pay | Admitting: Family Medicine

## 2018-11-02 ENCOUNTER — Other Ambulatory Visit: Payer: Self-pay

## 2018-11-02 ENCOUNTER — Ambulatory Visit: Payer: Medicaid Other | Attending: Family Medicine | Admitting: Family Medicine

## 2018-11-02 DIAGNOSIS — H538 Other visual disturbances: Secondary | ICD-10-CM

## 2018-11-02 DIAGNOSIS — H539 Unspecified visual disturbance: Secondary | ICD-10-CM

## 2018-11-02 NOTE — Progress Notes (Signed)
Virtual Visit via Telephone Note  Due to current restrictions/limitations of in office visits due to the COVID-19 pandemic, the scheduled clinical appointment was converted to a telehealth visit  I connected with Rickey Fisher on 11/02/18 at 3:35 PM by telephone after patient was contacted by CMA who verified that she was  speaking with the correct person using two identifiers.   I discussed the limitations, risks, security and privacy concerns of performing an evaluation and management service by telephone and the availability of in person appointments. I also discussed with the patient that there may be a patient responsible charge related to this service. The patient expressed understanding and agreed to proceed/gave consent to this visit.   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  Patient reports that when he is attempting to read magazines or books he will feel as if his vision is blurred and patient will have to blink several times to help improve his vision.  Patient denies any family history of diabetes.  Patient has had no personal history of diabetes.  Patient has had no increased thirst, no increased appetite and no increased frequency of urination.  Patient would like to be referred to an eye specialist for further evaluation.   Observations/Objective: Review of Systems  Constitutional: Negative for chills and fever.  HENT: Negative for congestion and sinus pain.   Eyes: Positive for blurred vision. Negative for  double vision, photophobia, pain, discharge and redness.  Respiratory: Negative for cough and shortness of breath.   Cardiovascular: Negative for chest pain and palpitations.  Gastrointestinal: Negative for abdominal pain, nausea and vomiting.  Genitourinary: Negative for frequency and urgency.  Musculoskeletal: Positive for back pain (chronic) and myalgias.  Neurological: Negative for dizziness and headaches.  Endo/Heme/Allergies: Negative for environmental allergies and polydipsia. Does not bruise/bleed easily.   Assessment and Plan: 1. Visual disturbance Patient with complaint of visual disturbance/blurred vision.  Patient on review of chart does not appear to be on any new medications which would impair his vision.  Patient had been prescribed Lyrica by his pain management physician however this medication was not covered by patient's insurance and therefore patient is not taking this medication.  Patient will be referred to ophthalmology for further evaluation and treatment of his visual disturbance  2. Blurred vision Patient with complaint of blurred vision.  Patient has had no recent steroid injections per review of chart and is not currently on medications that would increase his blood sugars.  Patient denies any symptoms suggestive of diabetes.  Patient on review of chart had most recent blood sugar level in November 2019 which was normal at 73.  Patient has been referred to ophthalmology for further evaluation and treatment.  Follow Up Instructions:Return if symptoms worsen or fail to improve.    I discussed the assessment and treatment plan with the patient. The patient was provided an opportunity to ask questions and all were answered. The patient agreed with the plan and demonstrated an understanding of the instructions.   The patient was advised to call back or seek an in-person evaluation  if the symptoms worsen or if the condition fails to improve as anticipated.  I provided 8  minutes of non-face-to-face time during this encounter.   Cain Saupe, MD

## 2018-11-02 NOTE — Progress Notes (Signed)
Patient verified DOB Patient has not taken medication today. Patient has only had juice and water today. Patient complains of left side pain that started yesterday. Patient states the pain comes and goes and the pain is described as dull aching pain.  Patient complains of blurred vision intermittently when reading.  Patient has never been prescribed readers in the past. Patient denies all prescreening regarding Covid for himself of any close family and friends he has been in contact with.

## 2020-02-28 ENCOUNTER — Telehealth: Payer: Self-pay | Admitting: Family Medicine

## 2020-02-28 NOTE — Telephone Encounter (Signed)
Call returned to patient # [phone], his sister answered and said he was not home. She was going to try to reach him and have him call this CM back # [phone]

## 2020-02-28 NOTE — Telephone Encounter (Signed)
Pt states the Medicaid plan that was chosen for him, ([employer]) will no longer pay for his colostomy bags.  I assisted pt with the information to call and have his plan switched to one of the providers that will pay for his bags.  In the meantime, pt states he has only 3 bags left, and concerned he may not get this straightened out in time and he will run out.  Pt wants to know if you have any way to get him some additional colostomy bags to get him through?

## 2020-03-03 NOTE — Telephone Encounter (Signed)
Casimiro Needle with [facility] [employer] is calling back in for additional information. Jeannett Senior states that they are in need of a ICD code for Ostomy.    : [phone] ext [phone]

## 2020-03-03 NOTE — Telephone Encounter (Signed)
Call returned to [facility],  # [phone] x [phone]. 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   He said that they are preparing an order of ostomy supplies to ship to the patient today.

## 2020-03-03 NOTE — Telephone Encounter (Signed)
Patient returned call to this CM.  He explained that his medicaid is now with [facility] and the company that has been providing him with his ostomy supplies , [employer], is not in network with [employer]. He said that he has contacted medicaid and was informed that he could opt out of [facility] and return to straight medicaid. In the meantime he is in need of ostomy supplies.  He was in agreement to allowing this CM to contact [facility] for assistance.   Call placed to Anise Salvo, [facility] and explained above noted issue.  He said that [facility] is in network with [facility] and he will check patient's medicaid ID number and see if [facility] is able to assist the patient.  He said that he will call the patient for more information as needed.  Call placed to patient and explained that Anise Salvo will be contacting him and hopefully will be able to assist him. He said that he understood

## 2020-03-06 ENCOUNTER — Telehealth: Payer: Self-pay

## 2020-03-06 NOTE — Telephone Encounter (Signed)
Call placed to patient to inquire if he has received his ostomy supplies and he said that he has not received them.  Call placed to [facility] # [phone] x [phone], spoke to Casimiro Needle who stated that the order has not shipped yet but he will work on it today.  This CM requested that he contact the patient with and update and he said he would call him

## 2020-03-18 ENCOUNTER — Telehealth: Payer: Self-pay

## 2020-03-18 NOTE — Telephone Encounter (Signed)
Call placed to Michael/[facility] to check on status of ostomy order. Message left with call back requested to this CM.

## 2020-03-19 ENCOUNTER — Telehealth: Payer: Self-pay

## 2020-03-19 NOTE — Telephone Encounter (Signed)
As per Norval Morton, the ostomy supplies were shipped to the patient 03/07/2020

## 2020-03-19 NOTE — Telephone Encounter (Signed)
Office notes has been faxed to fax number provided by Jeannett Senior in the email

## 2020-03-19 NOTE — Telephone Encounter (Signed)
[facility] called back in and stated that they did not rec the office notes that goes along with this order  Please fax to [phone]

## 2020-03-20 ENCOUNTER — Telehealth: Payer: Self-pay | Admitting: Family Medicine

## 2020-03-20 NOTE — Telephone Encounter (Signed)
Baron Hamper with [facility] called letting Laural Benes  know that he is l;ate getting back but he did send out pt order July 29 and it is coming time for a fu to check on how he is doing and offer  another supply order. Apologies [phone] for not getting back in timely matter.

## 2020-05-27 ENCOUNTER — Other Ambulatory Visit: Payer: Self-pay

## 2020-05-27 ENCOUNTER — Ambulatory Visit: Payer: Medicaid Other | Attending: Internal Medicine | Admitting: Internal Medicine

## 2020-05-27 DIAGNOSIS — Q459 Congenital malformation of digestive system, unspecified: Secondary | ICD-10-CM

## 2020-05-27 DIAGNOSIS — Z932 Ileostomy status: Secondary | ICD-10-CM

## 2020-05-27 NOTE — Progress Notes (Signed)
Virtual Visit via Telephone Note Due to current restrictions/limitations of in-office visits due to the COVID-19 pandemic, this scheduled clinical appointment was converted to a telehealth visit  I connected with Rickey Fisher on 05/27/20 at 3:04 p.m by telephone and verified that I am speaking with the correct person using two identifiers.  Location: Patient: home Provider: in my office   I discussed the limitations, risks, security and privacy concerns of performing an evaluation and management service by telephone and the availability of in person appointments. I also discussed with the patient that there may be a patient responsible charge related to this service. The patient expressed understanding and agreed to proceed.   History of Present Illness: Pt with congenital abn of intestine, ileostomy present, lumbar scoliosis.  PCP is Dr. Jillyn Hidden.  Pt needed forms signed for him to get ostomy supplies from [facility].  He reports that his ileostomy is working fine.  No surrounding skin infection.    Outpatient Encounter Medications as of 05/27/2020  Medication Sig  . cyclobenzaprine (FLEXERIL) 10 MG tablet Take 1 tablet (10 mg total) by mouth at bedtime as needed for muscle spasms.  . diazepam (VALIUM) 5 MG tablet Take 5 mg by mouth every 6 (six) hours as needed for anxiety.  Tery Sanfilippo Sodium 150 MG/15ML syrup Take 10 mLs (100 mg total) by mouth daily as needed (for moderate constipation). (Patient not taking: Reported on 09/13/2017)  . EPINEPHrine 0.3 mg/0.3 mL IJ SOAJ injection Inject 0.3 mLs (0.3 mg total) into the muscle once as needed for up to 1 dose (anaphylaxis).  Marland Kitchen gabapentin (NEURONTIN) 100 MG capsule Take 1 capsule (100 mg total) by mouth 3 (three) times daily.  Marland Kitchen ibuprofen (ADVIL,MOTRIN) 600 MG tablet Take 1 tablet (600 mg total) by mouth every 8 (eight) hours as needed. Take with food. (Patient not taking: Reported on 06/28/2018)  . oxycodone-acetaminophen (LYNOX) 10-300 MG tablet  Take 1 tablet by mouth every 6 (six) hours as needed for pain. (Patient not taking: Reported on 09/13/2017)  . terbinafine (LAMISIL) 250 MG tablet Take 1 tablet (250 mg total) by mouth daily. (Patient not taking: Reported on 09/13/2017)  . traMADol (ULTRAM) 50 MG tablet Take 1 tablet (50 mg total) by mouth every 8 (eight) hours as needed for moderate pain or severe pain.   No facility-administered encounter medications on file as of 05/27/2020.    Observations/Objective: No direct observation done.  Assessment and Plan: 1. Ileostomy present (HCC) 2. Congenital anomaly of intestine Form signed from [facility] so that he can get his supplies to care for the ileostomy.   Follow Up Instructions: As needed   I discussed the assessment and treatment plan with the patient. The patient was provided an opportunity to ask questions and all were answered. The patient agreed with the plan and demonstrated an understanding of the instructions.   The patient was advised to call back or seek an in-person evaluation if the symptoms worsen or if the condition fails to improve as anticipated.  I provided 5 minutes of non-face-to-face time during this encounter.   Jonah Blue, MD

## 2020-09-25 ENCOUNTER — Other Ambulatory Visit: Payer: Self-pay

## 2020-09-25 ENCOUNTER — Ambulatory Visit: Payer: Medicaid Other | Attending: Family Medicine | Admitting: Internal Medicine

## 2020-09-25 ENCOUNTER — Encounter: Payer: Self-pay | Admitting: Internal Medicine

## 2020-09-25 VITALS — BP 126/77 | HR 83 | Resp 16 | Ht 75.0 in | Wt 145.8 lb

## 2020-09-25 DIAGNOSIS — R636 Underweight: Secondary | ICD-10-CM | POA: Diagnosis not present

## 2020-09-25 DIAGNOSIS — G8929 Other chronic pain: Secondary | ICD-10-CM | POA: Diagnosis not present

## 2020-09-25 DIAGNOSIS — M545 Low back pain, unspecified: Secondary | ICD-10-CM | POA: Diagnosis not present

## 2020-09-25 NOTE — Progress Notes (Signed)
Pt is in the office today to get colostomy bag checked   Patient comes in for evaluation today.  He really does not have any acute needs he is just curious whether his colostomy needs to be checked.  He also has an extensive history of back surgeries and wonders whether that needs to be followed up.  He is also requesting a referral to ophthalmology and dentistry.  In general he feels well.  He is active with walking.  He admits to some back discomfort if he tries to bend or lift too much.  He states his appetite is good.  He does not overeat eat and he admits that part of this is because he tries to manage his colostomy outflow with p.o. intake. A long discussion with the patient around his medications.  He currently is not taking any medications and he feels well.  Past Medical History:  Diagnosis Date  . Cholelithiasis    CT abd 06/2010  . 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 Lumbar scoliosis 06/2010   noted on CT abdomen 06/2010. left convex lumbar     Social History   Socioeconomic History  . Marital status: Single    Spouse name: Not on file  . Number of children: 0  . Years of education: GED  . Highest education level: Not on file  Occupational History    Employer: NOT EMPLOYED  Tobacco Use  . Smoking status: Current Every Day Smoker    Packs/day: 0.10    Types: Cigarettes  . Smokeless tobacco: Never Used  Substance and Sexual Activity  . Alcohol use: No  . Drug use: Yes    Types: Marijuana  . Sexual activity: Never  Other Topics Concern  . Not on file  Social History Narrative   Worked at [employer]; Grew up in [place]; Went to [employer], dropped out in 11th grade,but acquired GED   Lives with friends/family [place]"   Has 3 brothers, 1 sister - all in [place] in [place] as well.   Social Determinants of Health   Financial Resource Strain: Not on file  Food Insecurity: Not on file  Transportation Needs: Not on  file  Physical Activity: Not on file  Stress: Not on file  Social Connections: Not on file  Intimate Partner Violence: Not on file    Past Surgical History:  Procedure Laterality Date  . COLON SURGERY    . COLOSTOMY     As a baby, does not know why\    Family History  Problem Relation Age of Onset  . Lupus Father   . Hypertension Mother     Allergies  Allergen Reactions  . Banana Anaphylaxis  . Shellfish Allergy Anaphylaxis  . Tomato Anaphylaxis  . Pollen Extract Itching    No current outpatient medications on file prior to visit.   No current facility-administered medications on file prior to visit.     patient denies chest pain, shortness of breath, orthopnea. Denies lower extremity edema, abdominal pain, change in appetite, change in bowel movements. Patient denies rashes, musculoskeletal complaints. No other specific complaints in a complete review of systems.   BP 126/77   Pulse 83   Resp 16   Ht 6' 3"  (1.905 m)   Wt 145 lb 12.8 oz (66.1 kg)   SpO2 98%   BMI 18.22 kg/m  thin male in no acute distress. HEENT exam atraumatic, normocephalic, neck supple without jugular venous  distention. Chest clear to auscultation cardiac exam S1-S2 are regular. Abdominal exam thin with bowel sounds, soft and nontender. Surgical scar on abdomen. Colostomy bag in RLQ. Extremities no edema. Neurologic exam is alert with a normal gait.  Assessment plan: 1) patient's BMI is 18.2.  He is underweight by that measurement.  I will check routine lab works including CBC, being met, TSH.  I reviewed lipid panel from November 19 which is normal.  No further evaluation for that.  His weight has been stable.  I talked to him about his diet.  He generally eats a healthy diet of fruits and vegetables and low-fat protein. 2) patient has requested ophthalmology appointment for routine eye care and dentistry appointment. 3) patient has a long history of back surgeries.  Patient continues to have  modest low back pain.  There has not been any acute changes.  I do not think any further evaluation is necessary at this time.

## 2020-09-26 LAB — CBC WITH DIFFERENTIAL/PLATELET
Basophils Absolute: 0 10*3/uL (ref 0.0–0.2)
Basos: 0 %
EOS (ABSOLUTE): 0.3 10*3/uL (ref 0.0–0.4)
Eos: 3 %
Hematocrit: 42.6 % (ref 37.5–51.0)
Hemoglobin: 13 g/dL (ref 13.0–17.7)
Immature Grans (Abs): 0 10*3/uL (ref 0.0–0.1)
Immature Granulocytes: 0 %
Lymphocytes Absolute: 1.7 10*3/uL (ref 0.7–3.1)
Lymphs: 20 %
MCH: 25.3 pg — ABNORMAL LOW (ref 26.6–33.0)
MCHC: 30.5 g/dL — ABNORMAL LOW (ref 31.5–35.7)
MCV: 83 fL (ref 79–97)
Monocytes Absolute: 0.8 10*3/uL (ref 0.1–0.9)
Monocytes: 9 %
Neutrophils Absolute: 5.9 10*3/uL (ref 1.4–7.0)
Neutrophils: 68 %
Platelets: 266 10*3/uL (ref 150–450)
RBC: 5.13 x10E6/uL (ref 4.14–5.80)
RDW: 13 % (ref 11.6–15.4)
WBC: 8.6 10*3/uL (ref 3.4–10.8)

## 2020-09-26 LAB — BASIC METABOLIC PANEL
BUN/Creatinine Ratio: 8 — ABNORMAL LOW (ref 9–20)
BUN: 8 mg/dL (ref 6–24)
CO2: 21 mmol/L (ref 20–29)
Calcium: 9.3 mg/dL (ref 8.7–10.2)
Chloride: 103 mmol/L (ref 96–106)
Creatinine, Ser: 0.99 mg/dL (ref 0.76–1.27)
GFR calc Af Amer: 107 mL/min/{1.73_m2} (ref >59)
GFR calc non Af Amer: 93 mL/min/{1.73_m2} (ref >59)
Glucose: 95 mg/dL (ref 65–99)
Potassium: 3.9 mmol/L (ref 3.5–5.2)
Sodium: 145 mmol/L — ABNORMAL HIGH (ref 134–144)

## 2020-09-26 LAB — TSH: TSH: 0.307 u[IU]/mL — ABNORMAL LOW (ref 0.450–4.500)

## 2021-08-19 ENCOUNTER — Encounter: Payer: Self-pay | Admitting: Internal Medicine

## 2021-10-12 ENCOUNTER — Telehealth: Payer: Self-pay | Admitting: Internal Medicine

## 2021-10-12 DIAGNOSIS — Z932 Ileostomy status: Secondary | ICD-10-CM

## 2021-10-12 NOTE — Telephone Encounter (Signed)
Pt says he is ut of colostomy bags, has been going without al weekend, and asking can he get a temporay supply until info is received from [facility]. Please call back ?

## 2021-10-12 NOTE — Telephone Encounter (Signed)
Pt called back and is very agitated. He want a fu call from Dr. Laural Benes and bags today and does not have time for an appt which is for 3/14. He states he will be coming up there today for his bags as he has no choice. [phone] ?

## 2021-10-12 NOTE — Telephone Encounter (Signed)
Returned pt call and made aware that once he has appt we can sign form.. Pt states he doesn't understand why he can't be signed now made pt aware that documentation is needed. Made pt aware that last documentation for his supplies was 05/2020. Pt then became agitated and started yelling. Made pt aware that I do apologize but we will have to wait till last appt. Went and spoke with Erskine Squibb and per Erskine Squibb she will reach out to the ostomy clinic  ?

## 2021-10-12 NOTE — Telephone Encounter (Signed)
Pt made an additional call regarding supplies, please advise.  ?

## 2021-10-12 NOTE — Telephone Encounter (Signed)
I have not received any information from [facility] in regards to this request. Also pt hasn't been seen in over a year and pt doesn't have any upcoming appts.  ?

## 2021-10-12 NOTE — Telephone Encounter (Signed)
Copied from CRM [id number]. Topic: General - Other ?>> Oct 09, 2021  9:56 AM Gaetana Michaelis A wrote: ?Reason for CRM: The patient is calling about an update on previously requested paperwork for their colostomy supplies  ? ?[facility] has requested additional information from the practice via fax since 09/29/21 ? ?The patient shares that the documents are related to insurance coverage for their colostomy supplies ? ?Please contact [facility] and the patient further  ? ?The patient is currently out of colostomy bags ?

## 2021-10-12 NOTE — Telephone Encounter (Signed)
Message sent to Maple Hudson, FNP/ Ostomy clinic inquiring if she is familiar with patient. ? ?Call placed to [facility] # [phone] ,spoke to [place] who stated that the orders need to be signed by provider so the patient can continue receiving his ostomy supplies.  She stated that any provider who sees the patient can sign the orders. They just need to provider their NPI.  ?

## 2021-10-13 ENCOUNTER — Telehealth: Payer: Self-pay

## 2021-10-13 NOTE — Telephone Encounter (Signed)
Call placed to patient to inform him that he can be seen at the [facility] for an assessment to have ostomy supplies re-ordered. Message left with call back requested to this CM or [facility]. Need to inform him of MMU location for 3/8 [facility]  and 3/9- [facility].  ?

## 2021-10-14 ENCOUNTER — Ambulatory Visit: Payer: Medicaid Other | Admitting: Physician Assistant

## 2021-10-14 ENCOUNTER — Other Ambulatory Visit: Payer: Self-pay

## 2021-10-14 DIAGNOSIS — Z932 Ileostomy status: Secondary | ICD-10-CM

## 2021-10-14 NOTE — Telephone Encounter (Signed)
noted 

## 2021-10-14 NOTE — Telephone Encounter (Signed)
Message received from Maple Hudson, FNP/Ostomy Clinic stating that this patient does not need to be seen by the provider prior to making a referral to the clinic. Clydie Braun can assist with further ostomy product selection. ? ?Call placed to patient and informed him of the availability of the [facility] and explained that the provider there should be able to assess him and sign orders for his supplies.  He then stated that he had back surgery and can't get to the [facility] but was agreeable to a virtual visit - MyChart. Informed him that [facility] will be contacting him at 1420 today for the appointment. ? ?I also explained to him that Dr Laural Benes will need to see him in person for his next appointment and he said as soon as he gets his ostomy supplies he can come to the clinic.  He was also agreeable to placing a referral for the ostomy clinic if Dr Laural Benes is in agreement.   ?

## 2021-10-14 NOTE — Patient Instructions (Signed)
?  Rickey Fisher, thank you for joining Roney Jaffe, PA-C for today's virtual visit.   ? ?Consent: ?(Patient) Rickey Fisher provided verbal consent for this virtual visit at the beginning of the encounter. ? ?Other Instructions ?I signed the order for your ostomy supplies.  Hopefully you should have the supplies very soon.  I did create an office visit for you with Dr. Laural Benes, I know you had said you preferred morning appointments but her first morning appointment was not until August, so I did take the liberty of making you an appointment for January 14, 2022 at 2 10 in the afternoon.  I will notify Erskine Squibb the social worker at [facility] and wellness center to reach out to you to help with transportation.  Please let us know if there is anything else we can do for you. ? ? ?Roney Jaffe, PA-C ?Physician Assistant ?[facility] ?[email or web address] ? ?

## 2021-10-14 NOTE — Telephone Encounter (Signed)
Call placed to patient # [phone]  to inform him that he can be seen at the [facility] for an assessment to have ostomy supplies re-ordered. Message left with call back requested to this CM. Need to inform him of MMU location for 3/8 [facility]  and 3/9- [facility]. ?

## 2021-10-14 NOTE — Progress Notes (Addendum)
Rickey Fisher has provided verbal consent on 10/14/2021 for a virtual visit (telephone). ?  ?Roney Jaffe, PA-C  ? ?Date: 10/14/2021 2:23 PM ? ? ?Virtual Visit via Telephone Note  ? ?IRoney Jaffe, connected with  Rickey Fisher  ([id number], [birth year]) on 10/14/21 at  2:00 PM EST by a  telemedicine application and verified that I am speaking with the correct person using two identifiers. ? ?Location: ?Patient: Virtual Visit Location Patient: Home ?Provider: Virtual Visit Location Provider: Office/Clinic ?  ?I discussed the limitations of evaluation and management by telemedicine and the availability of in person appointments. The patient expressed understanding and agreed to proceed.   ? ?History of Present Illness: ?Rickey Fisher is a 46 y.o. reports that he has been out of his ostomy supplies and has been having to reuse his bags.  Reports that he has had some irritation around his ostomy site due to reusing the bags, denies any purulence or signs of infection. ? ?Reports that he has difficulties with transportation and has been unable to follow-up with his medical appointments. ? ?States that he is willing to follow-up with the ostomy nurse at the hospital. ? ?No other concerns at this time. ? ?Review of Systems  ?Constitutional:  Negative for chills and fever.  ?HENT: Negative.    ?Respiratory:  Negative for shortness of breath.   ?Cardiovascular:  Negative for chest pain.  ?Genitourinary: Negative.   ?Musculoskeletal: Negative.   ?Skin:  Negative for rash.  ?Neurological: Negative.   ?Endo/Heme/Allergies: Negative.   ?Psychiatric/Behavioral: Negative.    ? ? ?HPI: HPI  ?Problems:  ?Patient Active Problem List  ? Diagnosis Date Noted  ? Weakness 07/13/2017  ? Status post lumbar spinal fusion 07/13/2017  ? Left lumbar radiculitis 09/07/2016  ? Other idiopathic scoliosis, thoracolumbar region 07/13/2016  ? Poor dentition 05/20/2016  ? Onychomycosis of toenail 05/20/2016  ? Tinea pedis 05/20/2016  ? 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 Lumbar spine scoliosis 05/07/2016  ? Ileostomy present (HCC) 04/07/2012  ? Congenital anomaly of intestine [birth year]1977  ?  ?Allergies:  ?Allergies  ?Allergen Reactions  ? Banana Anaphylaxis  ? Shellfish Allergy Anaphylaxis  ? Tomato Anaphylaxis  ? Pollen Extract Itching  ? ?Medications: No current outpatient medications on file. ? ?Observations/Objective: ?Medical history and current medications reviewed, no physical exam completed ? ? ?Assessment and Plan: ?There are no diagnoses linked to this encounter. ?1. Ileostomy present (HCC) ?Ostomy supply order signed on patient's behalf.  Patient was scheduled an appointment for an in person visit with primary care provider for January 14, 2022.  Red flags given for prompt reevaluation. ? ? ?Follow Up Instructions: ?I discussed the assessment and treatment plan with the patient. The patient was provided an opportunity to ask questions and all were answered. The patient agreed with the plan and demonstrated an understanding of the instructions.  A copy of instructions were sent to the patient via MyChart unless otherwise noted below.  ? ? ? ?The patient was advised to call back or seek an in-person evaluation if the symptoms worsen or if the condition fails to improve as anticipated. ? ?Time:  ?I spent 12 minutes with the patient via telehealth technology discussing the above problems/concerns.   ? ?Edithe Dobbin S Mayers, PA-C ? ?

## 2021-10-14 NOTE — Progress Notes (Signed)
Called pt for tele visit. Patient has eaten today. Pt reports pain in ostomy site.  ?

## 2021-10-15 ENCOUNTER — Telehealth: Payer: Self-pay

## 2021-10-15 NOTE — Telephone Encounter (Signed)
Order for ostomy supplies and CMN faxed to [facility] ?

## 2021-10-20 ENCOUNTER — Ambulatory Visit (HOSPITAL_COMMUNITY)
Admission: RE | Admit: 2021-10-20 | Discharge: 2021-10-20 | Disposition: A | Discharge status: Home / Self Care | Payer: Medicaid Other | Source: Ambulatory Visit | Attending: Nurse Practitioner | Admitting: Nurse Practitioner

## 2021-10-20 ENCOUNTER — Telehealth: Payer: Medicaid Other | Admitting: Nurse Practitioner

## 2021-10-20 DIAGNOSIS — L259 Unspecified contact dermatitis, unspecified cause: Secondary | ICD-10-CM | POA: Diagnosis not present

## 2021-10-20 DIAGNOSIS — Z432 Encounter for attention to ileostomy: Secondary | ICD-10-CM | POA: Diagnosis present

## 2021-10-20 DIAGNOSIS — L24B1 Irritant contact dermatitis related to digestive stoma or fistula: Secondary | ICD-10-CM

## 2021-10-20 NOTE — Discharge Instructions (Signed)
I will update orders with [facility].  ?I will add barrier ring and powder/skin prep ?

## 2021-10-20 NOTE — Progress Notes (Signed)
[facility]  ? ?Reason for visit:  ?RLQ ileostomy ?HPI:  ?Stoma since birth ?ROS  ?Review of Systems ?Vital signs:  ?BP (!) 132/91   Pulse 69   Temp 98.8 ?F (37.1 ?C)   Resp 17   SpO2 97%  ?Exam:  ?Physical Exam  ?Stoma type/location:  RLQ ileostomy ?Stomal assessment/size:  1 1/4" well budded ?Peristomal assessment:  macerated, cracked skin to peristomal area ?Treatment options for stomal/peristomal skin: wears 1 piece flat pouch. Implemented powder and skin prep as well as barrier ring to peristomal skin to protect.   ?Output: liquid green stool ?Ostomy pouching: 1pc. Flat pouch with powder skin prep and barrier ring ?Education provided:  pouch change performed.  Discussed rationale for powder and skin prep as well as barrier ring ? ?  ?Impression/dx  ?Contact dermatitis ?Discussion  ?Adding barrier ring.  Sent home with samples  ?Plan  ?Call [facility] to update orders.  ? ? ? ?Visit time: 45 minutes.  ? ?Maple Hudson FNP-BC ? ?  ?

## 2022-01-14 ENCOUNTER — Ambulatory Visit: Payer: Medicaid Other | Attending: Internal Medicine | Admitting: Internal Medicine

## 2022-01-14 ENCOUNTER — Encounter: Payer: Self-pay | Admitting: Internal Medicine

## 2022-01-14 VITALS — BP 127/75 | HR 63 | Temp 98.0°F | Resp 16 | Wt 144.6 lb

## 2022-01-14 DIAGNOSIS — M25531 Pain in right wrist: Secondary | ICD-10-CM | POA: Diagnosis not present

## 2022-01-14 DIAGNOSIS — Z932 Ileostomy status: Secondary | ICD-10-CM | POA: Diagnosis not present

## 2022-01-14 DIAGNOSIS — R636 Underweight: Secondary | ICD-10-CM | POA: Diagnosis not present

## 2022-01-14 NOTE — Progress Notes (Signed)
Patient ID: Rickey SextonCharles Fisher, male    DOB: 04/16/1976  MRN: [id number]  CC: For chronic disease management  Subjective: Rickey SextonCharles Buesing is a 46 y.o. male who presents for chronic ds management His concerns today include:  Pt with congenital abn of intestine, ileostomy present, lumbar scoliosis  Injured RT hand 2.5 wks ago. Fell in the rain Had some swelling around the wrist. Had a wrist splint which he has been using. Feeling better, swelling has gone away but still sore  with certain movement.   Ileostomy site ok.  Getting his supplies through [facility].  Told me they will be sending me form for him to get RF on barrier seal He is underweight for height.  However weight is stable.  He reports he does well with his eating habits.  He does not eat too much beef or pork.  Loves fruits and vegetables.  Does a lot of walking. Patient Active Problem List   Diagnosis Date Noted   Weakness 07/13/2017   Status post lumbar spinal fusion 07/13/2017   Left lumbar radiculitis 09/07/2016   Other idiopathic scoliosis, thoracolumbar region 07/13/2016   Poor dentition 05/20/2016   Onychomycosis of toenail 05/20/2016   Tinea pedis 05/20/2016   Anaphylactic shock due to adverse food reaction 05/20/2016   Lumbar spine scoliosis 05/07/2016   Ileostomy present (HCC) 04/07/2012   Congenital anomaly of intestine Nov 11, 1975     No current outpatient medications on file prior to visit.   No current facility-administered medications on file prior to visit.    Allergies  Allergen Reactions   Banana Anaphylaxis   Onion Anaphylaxis    Raw   Other Anaphylaxis    RASPBERRIES   Peach [Prunus Persica] Anaphylaxis   Shellfish Allergy Anaphylaxis   Tomato Anaphylaxis   Bee Pollen Itching   Pollen Extract Itching    Social History   Socioeconomic History   Marital status: Single    Spouse name: Not on file   Number of children: 0   Years of education: GED   Highest education level: Not on file   Occupational History    Employer: NOT EMPLOYED  Tobacco Use   Smoking status: Every Day    Packs/day: 0.10    Types: Cigarettes   Smokeless tobacco: Never  Substance and Sexual Activity   Alcohol use: No   Drug use: Yes    Types: Marijuana   Sexual activity: Never  Other Topics Concern   Not on file  Social History Narrative   Worked at [employer]; [place]rew up in [place]; Went to [employer], dropped out in 11th grade,but acquired GED   Lives with friends/family [place] on on [place] the [place]"   Has 3 brothers, 1 sister - all in [place]   Mom & Dad in [place] as well.   Social Determinants of Health   Financial Resource Strain: Not on file  Food Insecurity: Not on file  Transportation Needs: Not on file  Physical Activity: Not on file  Stress: Not on file  Social Connections: Not on file  Intimate Partner Violence: Not on file    Family History  Problem Relation Age of Onset   Lupus Father    Hypertension Mother     Past Surgical History:  Procedure Laterality Date   COLON SURGERY     COLOSTOMY     As a baby, does not know why\    ROS: Review of Systems Negative except as stated above  PHYSICAL EXAM: BP 127/75  Pulse 63   Temp 98 F (36.7 C) (Oral)   Resp 16   Wt 144 lb 9.6 oz (65.6 kg)   SpO2 97%   BMI 18.07 kg/m   Wt Readings from Last 3 Encounters:  01/14/22 144 lb 9.6 oz (65.6 kg)  09/25/20 145 lb 12.8 oz (66.1 kg)  06/28/18 142 lb 12.8 oz (64.8 kg)    Physical Exam   General appearance - alert, very lean appearing, middle-aged African-American male and in no distress Mental status - normal mood, behavior, speech, dress, motor activity, and thought processes Chest - clear to auscultation, no wheezes, rales or rhonchi, symmetric air entry Heart - normal rate, regular rhythm, normal S1, S2, no murmurs, rubs, clicks or gallops Musculoskeletal - RT hand: No edema or erythema noted of the right wrist.  No point tenderness.  He has mild  discomfort with extension of the wrist. Extremities - peripheral pulses normal, no pedal edema, no clubbing or cyanosis Abdomen: Ostomy site appears clean with no signs of infection.  Minimal stool in his bag.    Latest Ref Rng & Units 09/25/2020    3:19 PM 06/28/2018   12:24 PM 05/20/2016    4:00 PM  CMP  Glucose 65 - 99 mg/dL 95  73  76   BUN 6 - 24 mg/dL 8  7  5    Creatinine 0.76 - 1.27 mg/dL  5.88  5.02   Sodium 134 - 144 mmol/L 145  139  141   Potassium 3.5 - 5.2 mmol/L 3.9  3.8  4.2   Chloride 96 - 106 mmol/L 103  103  108   CO2 20 - 29 mmol/L 21  21  25    Calcium 8.7 - 10.2 mg/dL 9.3  9.3  8.9   Total Protein 6.0 - 8.5 g/dL  7.0  6.5   Total Bilirubin 0.0 - 1.2 mg/dL  0.9  0.9   Alkaline Phos 39 - 117 IU/L  61  52   AST 0 - 40 IU/L  29  18   ALT 0 - 44 IU/L  29  13    Lipid Panel     Component Value Date/Time   CHOL 139 06/28/2018 1224   TRIG 61 06/28/2018 1224   HDL 78 06/28/2018 1224   CHOLHDL 1.8 06/28/2018 1224   LDLCALC 49 06/28/2018 1224    CBC    Component Value Date/Time   WBC 8.6 09/25/2020 1519   WBC 9.1 05/20/2016 1600   RBC 5.13 09/25/2020 1519   RBC 5.53 05/20/2016 1600   HGB 13.0 09/25/2020 1519   HCT 42.6 09/25/2020 1519   PLT 266 09/25/2020 1519   MCV 83 09/25/2020 1519   MCH 25.3 (L) 09/25/2020 1519   MCH 24.6 (L) 05/20/2016 1600   MCHC 30.5 (L) 09/25/2020 1519   MCHC 30.6 (L) 05/20/2016 1600   RDW 13.0 09/25/2020 1519   LYMPHSABS 1.7 09/25/2020 1519   MONOABS 0.7 07/03/2010 0210   EOSABS 0.3 09/25/2020 1519   BASOSABS 0.0 09/25/2020 1519    ASSESSMENT AND PLAN:  1. Ileostomy present [facility]) Patient reports no issue with the ileostomy at this time.  On physical exam, the site looks good.  I will await formal from [facility] for his supplies.  2. Underweight Weight is stable.  Encourage him to eat a balanced meal and increase protein in the diet. - CBC; Future - Comprehensive metabolic panel; Future  3. Wrist pain, right I think  this was a sprain and  seems to be getting better per his report.  However I offered to get an x-ray but I doubt any fracture.  Patient declines the x-ray.  Continue conservative management.  If this does not resolve completely or gets worse he will let me know and we can refer to orthopedics.   Patient was given the opportunity to ask questions.  Patient verbalized understanding of the plan and was able to repeat key elements of the plan.   This documentation was completed using Paediatric nurse.  Any transcriptional errors are unintentional.  Orders Placed This Encounter  Procedures   CBC   Comprehensive metabolic panel     Requested Prescriptions    No prescriptions requested or ordered in this encounter    Return in about 6 months (around 07/16/2022) for Give lab appt for 1 wk from today.  Jonah Blue, MD, FACP

## 2022-03-02 ENCOUNTER — Encounter (HOSPITAL_COMMUNITY): Payer: Self-pay | Admitting: Nurse Practitioner

## 2023-01-26 ENCOUNTER — Telehealth: Payer: Self-pay

## 2023-01-26 NOTE — Telephone Encounter (Signed)
Call received from patient. He said he will need ostomy supplies and [facility] needs new orders. He has not seen Dr Laural Benes in over a year and his next appointment with her is in August and he would like to be seen sooner.  He said he currently has 10 ostomy pouches.    I was able to schedule him with Dr Laural Benes for 02/22/2023 and he said that would be fine.  I also gave him the phone number for Mike Gip, FNP- [facility]: [phone] and instructed him to call that clinic if he needs some sample supplies until he is able to receive an order of supplies from [facility]. He was appreciative and said he would call the ostomy clinic now to let then know what is going on.

## 2023-01-31 NOTE — Telephone Encounter (Signed)
Kristin from [employer], called in, I let her know of notes of pt appt scheduled for ostomy supplies

## 2023-01-31 NOTE — Telephone Encounter (Signed)
Noted  

## 2023-02-22 ENCOUNTER — Ambulatory Visit: Payer: 59 | Attending: Internal Medicine | Admitting: Internal Medicine

## 2023-02-22 ENCOUNTER — Telehealth: Payer: Self-pay | Admitting: Internal Medicine

## 2023-02-22 ENCOUNTER — Encounter: Payer: Self-pay | Admitting: Internal Medicine

## 2023-02-22 VITALS — BP 117/74 | HR 59 | Temp 98.1°F | Ht 75.0 in | Wt 139.8 lb

## 2023-02-22 DIAGNOSIS — Z932 Ileostomy status: Secondary | ICD-10-CM | POA: Diagnosis not present

## 2023-02-22 DIAGNOSIS — F320 Major depressive disorder, single episode, mild: Secondary | ICD-10-CM | POA: Diagnosis not present

## 2023-02-22 DIAGNOSIS — M545 Low back pain, unspecified: Secondary | ICD-10-CM

## 2023-02-22 DIAGNOSIS — E46 Unspecified protein-calorie malnutrition: Secondary | ICD-10-CM

## 2023-02-22 DIAGNOSIS — Z1211 Encounter for screening for malignant neoplasm of colon: Secondary | ICD-10-CM

## 2023-02-22 DIAGNOSIS — Z91018 Allergy to other foods: Secondary | ICD-10-CM

## 2023-02-22 DIAGNOSIS — F17200 Nicotine dependence, unspecified, uncomplicated: Secondary | ICD-10-CM

## 2023-02-22 LAB — POCT URINALYSIS DIP (CLINITEK)
Bilirubin, UA: NEGATIVE
Glucose, UA: NEGATIVE mg/dL
Ketones, POC UA: NEGATIVE mg/dL
Nitrite, UA: NEGATIVE
POC PROTEIN,UA: 100 — AB
Spec Grav, UA: >=1.03 — AB (ref 1.010–1.025)
Urobilinogen, UA: 0.2 E.U./dL
pH, UA: 6 (ref 5.0–8.0)

## 2023-02-22 MED ORDER — EPINEPHRINE 0.3 MG/0.3ML IJ SOAJ: 0.3000 mg | INTRAMUSCULAR | 1 refills | Status: AC | PRN

## 2023-02-22 NOTE — Telephone Encounter (Signed)
Notes successfully faxed to [facility] on 02/22/2023.

## 2023-02-22 NOTE — Progress Notes (Signed)
Patient ID: Rickey Fisher, male    DOB: Jul 23, 1976  MRN: [id number]  CC: Follow-up (Requesting new ostomy supply order. Ottis Stain on lower L back X1 week/Yes to Tdap vax. )   Subjective: Rickey Fisher is a 47 y.o. male who presents for chronic ds management His concerns today include:  Pt with congenital abn of intestine, ileostomy present, lumbar scoliosis   Needing RF on ostomy supplies.  He gets this through [facility]. Ostomy site is okay.  No rash or redness.  No leakage.   Wgh down 5 lbs since last sen 1 yr ago. Lives alone and does his own cooking.  Usually just drinks coffee in a.m because no appetite in a.m. First meal of day around 2 p.m which is usually fruits or noodles with veggies. Dinner may be rice and veggies or fish and veggies.  Drinks water and Gatorade. Does not eat out much.  Reports he change his diet.  Stopped eating chicken.  Eats very little beef or pork; more fruits and veggies. Not drinking any supplements like Ensure.  Use to drink Ensure but too expensive; not sure if his insurance will cover for it.    Tob dep:  smokes 1-2 cigarettes a day or a cigar a day.  Smoked since age 84.  Never a pk/day smoker.  Smokes to calm his nerves and decrease stress.  Feels he can quit cold [place].  Pos dep/GAD7 screen: reports he gets stress sometimes when things are not going the way he wants them to.  Some financial stress; all money goes to paying bill.  Not much left over for simple things like going to get a hair cut.  He sets a small amount a side every mth to build up emergency fund.  Talks to his sister.  Does not like dealing with other people.   Complains of intermittent left lower back pain x 2 months.  Pain is sharp when it comes on.  No associated dysuria or hematuria.  Worse with prolonged sitting.  Sometimes gets in the shower because of the warm water helps.  No initiating factors that he recalls.  He had surgery on his back several years ago for scoliosis.  Has rods in  the back.  He has not had any falls.  Pain does not radiate down legs.  No numbness or tingling in the legs.  Requests refill on EpiPen.  Has several food allergies.  HM: due for colon cancer screen.  No fhx of colon CA Patient Active Problem List   Diagnosis Date Noted   Food allergy 02/22/2023   Tobacco dependence 02/22/2023   Major depressive disorder, single episode, mild (HCC) 02/22/2023   Weakness 07/13/2017   Status post lumbar spinal fusion 07/13/2017   Left lumbar radiculitis 09/07/2016   Other idiopathic scoliosis, thoracolumbar region 07/13/2016   Poor dentition 05/20/2016   Onychomycosis of toenail 05/20/2016   Tinea pedis 05/20/2016   Anaphylactic shock due to adverse food reaction 05/20/2016   Lumbar spine scoliosis 05/07/2016   Ileostomy present (HCC) 04/07/2012   Congenital anomaly of intestine 05[birth year]29[birth year]77     No current outpatient medications on file prior to visit.   No current facility-administered medications on file prior to visit.    Allergies  Allergen Reactions   Banana Anaphylaxis   Onion Anaphylaxis    Raw   Other Anaphylaxis    RASPBERRIES   Peach [Prunus Persica] Anaphylaxis   Shellfish Allergy Anaphylaxis   Tomato Anaphylaxis   Bee Pollen Itching  Pollen Extract Itching    Social History   Socioeconomic History   Marital status: Single    Spouse name: Not on file   Number of children: 0   Years of education: GED   Highest education level: Not on file  Occupational History    Employer: NOT EMPLOYED  Tobacco Use   Smoking status: Every Day    Current packs/day: 0.10    Types: Cigarettes   Smokeless tobacco: Never  Substance and Sexual Activity   Alcohol use: No   Drug use: Yes    Types: Marijuana   Sexual activity: Never  Other Topics Concern   Not on file  Social History Narrative   Worked at [employer]; Grew up in [place]; Went to [employer], dropped out in 11th grade,but acquired GED   Lives with  friends/family off on on [place]"   Has 3 brothers, 1 sister - all in [place]   Mom & Dad in [place] as well.   Social Determinants of Health   Financial Resource Strain: Not on file  Food Insecurity: Not on file  Transportation Needs: Not on file  Physical Activity: Not on file  Stress: Not on file  Social Connections: Not on file  Intimate Partner Violence: Not on file    Family History  Problem Relation Age of Onset   Lupus Father    Hypertension Mother     Past Surgical History:  Procedure Laterality Date   COLON SURGERY     COLOSTOMY     As a baby, does not know why\    ROS: Review of Systems Negative except as stated above  PHYSICAL EXAM: BP 117/74 (BP Location: Left Arm, Patient Position: Sitting, Cuff Size: Normal)   Pulse (!) 59   Temp 98.1 F (36.7 C) (Oral)   Ht 6\' 3"  (1.905 m)   Wt 139 lb 12.8 oz (63.4 kg)   SpO2 100%   BMI 17.47 kg/m   Wt Readings from Last 3 Encounters:  02/22/23 139 lb 12.8 oz (63.4 kg)  01/14/22 144 lb 9.6 oz (65.6 kg)  09/25/20 145 lb 12.8 oz (66.1 kg)    Physical Exam   General appearance -middle-age appearing African-American male in NAD.  He appears underweight for height.  He is wearing several layers of clothing Mental status - normal mood, behavior, speech, dress, motor activity, and thought processes Neck - supple, no significant adenopathy Chest - clear to auscultation, no wheezes, rales or rhonchi, symmetric air entry Heart - normal rate, regular rhythm, normal S1, S2, no murmurs, rubs, clicks or gallops Musculoskeletal -slight tenderness on palpation of the upper left lumbar paraspinal muscle Extremities - peripheral pulses normal, no pedal edema, no clubbing or cyanosis Abdomen: Ostomy bag located in the right lower quadrant.  No leakage noted.  Light brown stools in the bag.    02/22/2023   11:22 AM 01/14/2022    2:19 PM 09/25/2020    2:28 PM  Depression screen PHQ 2/9  Decreased Interest 2 1 1   Down,  Depressed, Hopeless 2 0 0  PHQ - 2 Score 4 1 1   Altered sleeping 2 0   Tired, decreased energy 2 0   Change in appetite 1 1   Feeling bad or failure about yourself  2 0   Trouble concentrating 2 0   Moving slowly or fidgety/restless 0 1   Suicidal thoughts 0 0   PHQ-9 Score 13 3   Difficult doing work/chores  Not difficult at  all       02/22/2023   11:22 AM 09/25/2020    2:28 PM 05/27/2020    2:15 PM 06/28/2018   10:45 AM  GAD 7 : Generalized Anxiety Score  Nervous, Anxious, on Edge 2 0 0 2  Control/stop worrying 1 1 0 2  Worry too much - different things 2 1 0 3  Trouble relaxing 2 1 0 3  Restless 1 1 0 3  Easily annoyed or irritable 2 0 0 2  Afraid - awful might happen 0 0 0 1  Total GAD 7 Score 10 4 0 16        Latest Ref Rng & Units 09/25/2020    3:19 PM 06/28/2018   12:24 PM 05/20/2016    4:00 PM  CMP  Glucose 65 - 99 mg/dL 95  73  76   BUN 6 - 24 mg/dL 8  7  5    Creatinine 0.76 - 1.27 mg/dL 1.61  0.96  0.45   Sodium 134 - 144 mmol/L 145  139  141   Potassium 3.5 - 5.2 mmol/L 3.9  3.8  4.2   Chloride 96 - 106 mmol/L 103  103  108   CO2 20 - 29 mmol/L 21  21  25    Calcium 8.7 - 10.2 mg/dL 9.3  9.3  8.9   Total Protein 6.0 - 8.5 g/dL  7.0  6.5   Total Bilirubin 0.0 - 1.2 mg/dL  0.9  0.9   Alkaline Phos 39 - 117 IU/L  61  52   AST 0 - 40 IU/L  29  18   ALT 0 - 44 IU/L  29  13    Lipid Panel     Component Value Date/Time   CHOL 139 06/28/2018 1224   TRIG 61 06/28/2018 1224   HDL 78 06/28/2018 1224   CHOLHDL 1.8 06/28/2018 1224   LDLCALC 49 06/28/2018 1224    CBC    Component Value Date/Time   WBC 8.6 09/25/2020 1519   WBC 9.1 05/20/2016 1600   RBC 5.13 09/25/2020 1519   RBC 5.53 05/20/2016 1600   HGB 13.0 09/25/2020 1519   HCT 42.6 09/25/2020 1519   PLT 266 09/25/2020 1519   MCV 83 09/25/2020 1519   MCH 25.3 (L) 09/25/2020 1519   MCH 24.6 (L) 05/20/2016 1600   MCHC 30.5 (L) 09/25/2020 1519   MCHC 30.6 (L) 05/20/2016 1600   RDW 13.0 09/25/2020  1519   LYMPHSABS 1.7 09/25/2020 1519   MONOABS 0.7 07/03/2010 0210   EOSABS 0.3 09/25/2020 1519   BASOSABS 0.0 09/25/2020 1519   Results for orders placed or performed in visit on 02/22/23  POCT URINALYSIS DIP (CLINITEK)  Result Value Ref Range   Color, UA yellow yellow   Clarity, UA cloudy (A) clear   Glucose, UA negative negative mg/dL   Bilirubin, UA negative negative   Ketones, POC UA negative negative mg/dL   Spec Grav, UA >=4.098 (A) 1.010 - 1.025   Blood, UA moderate (A) negative   pH, UA 6.0 5.0 - 8.0   POC PROTEIN,UA =100 (A) negative, trace   Urobilinogen, UA 0.2 0.2 or 1.0 E.U./dL   Nitrite, UA Negative Negative   Leukocytes, UA Small (1+) (A) Negative    ASSESSMENT AND PLAN:  1. Ileostomy present [facility]) This progress note will be sent to [facility] so that he can continue to receive his ostomy supplies. Site is functional  2. Protein-calorie malnutrition, unspecified severity (HCC) Encouraged  him to try to eat 3 solid meals a day.  Prescription given for Ensure shakes to supplement his meals. - CBC - Comprehensive metabolic panel - For home use only DME Other see comment  3. Major depressive disorder, single episode, mild (HCC) Patient declines referral to behavioral health for counseling.  He also is not interested in medication.  He is aware of locations of food pantry's/food banks to get additional food supplies if needed.  4. Tobacco dependence Strongly encouraged him to quit.  Does not seem that he is ready to give a trial of quitting at this time.  5. Acute left-sided low back pain without sciatica Point-of-care UA showed moderate blood.  Patient denies any gross hematuria.  We will send it off for full urinalysis with reflex. I think the back pain however most most likely musculoskeletal in nature.  Recommend use of Tylenol as needed and heating pad.  Follow-up if no improvement. - POCT URINALYSIS DIP (CLINITEK) - Urinalysis, Routine w reflex  microscopic  6. Screening for colon cancer Discussed colon cancer screening methods.  He is agreeable to referral to gastroenterology for colonoscopy if it can be done. - Ambulatory referral to Gastroenterology  7. Food allergy - EPINEPHrine 0.3 mg/0.3 mL IJ SOAJ injection; Inject 0.3 mg into the muscle as needed for anaphylaxis.  Dispense: 1 each; Refill: 1    Patient was given the opportunity to ask questions.  Patient verbalized understanding of the plan and was able to repeat key elements of the plan.   This documentation was completed using Paediatric nurse.  Any transcriptional errors are unintentional.  Orders Placed This Encounter  Procedures   For home use only DME Other see comment   CBC   Comprehensive metabolic panel   Urinalysis, Routine w reflex microscopic   Ambulatory referral to Gastroenterology   POCT URINALYSIS DIP (CLINITEK)     Requested Prescriptions   Signed Prescriptions Disp Refills   EPINEPHrine 0.3 mg/0.3 mL IJ SOAJ injection 1 each 1    Sig: Inject 0.3 mg into the muscle as needed for anaphylaxis.    Return in about 6 months (around 08/25/2023).  Jonah Blue, MD, FACP

## 2023-02-23 ENCOUNTER — Telehealth: Payer: Self-pay | Admitting: *Deleted

## 2023-02-23 ENCOUNTER — Telehealth: Payer: Self-pay | Admitting: Internal Medicine

## 2023-02-23 ENCOUNTER — Other Ambulatory Visit: Payer: Self-pay | Admitting: Internal Medicine

## 2023-02-23 DIAGNOSIS — N390 Urinary tract infection, site not specified: Secondary | ICD-10-CM

## 2023-02-23 LAB — COMPREHENSIVE METABOLIC PANEL
ALT: 10 IU/L (ref 0–44)
AST: 15 IU/L (ref 0–40)
Albumin: 4.4 g/dL (ref 4.1–5.1)
Alkaline Phosphatase: 74 IU/L (ref 44–121)
BUN/Creatinine Ratio: 5 — ABNORMAL LOW (ref 9–20)
BUN: 7 mg/dL (ref 6–24)
Bilirubin Total: 0.4 mg/dL (ref 0.0–1.2)
CO2: 24 mmol/L (ref 20–29)
Calcium: 9.4 mg/dL (ref 8.7–10.2)
Chloride: 103 mmol/L (ref 96–106)
Creatinine, Ser: 1.29 mg/dL — ABNORMAL HIGH (ref 0.76–1.27)
Globulin, Total: 2.7 g/dL (ref 1.5–4.5)
Glucose: 81 mg/dL (ref 70–99)
Potassium: 4.1 mmol/L (ref 3.5–5.2)
Sodium: 139 mmol/L (ref 134–144)
Total Protein: 7.1 g/dL (ref 6.0–8.5)
eGFR: 70 mL/min/{1.73_m2} (ref >59)

## 2023-02-23 LAB — MICROSCOPIC EXAMINATION
Casts: NONE SEEN /lpf
Epithelial Cells (non renal): >10 /hpf — AB (ref 0–10)
WBC, UA: >30 /hpf — AB (ref 0–5)

## 2023-02-23 LAB — URINALYSIS, ROUTINE W REFLEX MICROSCOPIC
Bilirubin, UA: NEGATIVE
Glucose, UA: NEGATIVE
Ketones, UA: NEGATIVE
Nitrite, UA: POSITIVE — AB
Specific Gravity, UA: 1.022 (ref 1.005–1.030)
Urobilinogen, Ur: 0.2 mg/dL (ref 0.2–1.0)
pH, UA: 6 (ref 5.0–7.5)

## 2023-02-23 LAB — CBC
Hematocrit: 41.3 % (ref 37.5–51.0)
Hemoglobin: 12.4 g/dL — ABNORMAL LOW (ref 13.0–17.7)
MCH: 24.3 pg — ABNORMAL LOW (ref 26.6–33.0)
MCHC: 30 g/dL — ABNORMAL LOW (ref 31.5–35.7)
MCV: 81 fL (ref 79–97)
Platelets: 286 10*3/uL (ref 150–450)
RBC: 5.1 x10E6/uL (ref 4.14–5.80)
RDW: 13.6 % (ref 11.6–15.4)
WBC: 7.9 10*3/uL (ref 3.4–10.8)

## 2023-02-23 MED ORDER — CIPROFLOXACIN HCL 500 MG PO TABS: 500.0000 mg | ORAL_TABLET | Freq: Two times a day (BID) | ORAL | 0 refills | Status: AC

## 2023-02-23 NOTE — Telephone Encounter (Signed)
Phone call placed to patient this morning to go over lab results.  I got an automated voicemail stating that the text mail subscriber is not available at this time.  I left a message informing him of who I am and that I was calling to go over lab results.  Requested that he give [place] a call back.  I left our phone number. Phone call then placed to the second mobile number listed as [phone].  Male answered stating that he is [name] and that he does not know who Soul Hackman is.  He states that it is the wrong number.  If patient calls back, please let him know that urine was consistent with urinary tract infection.  He has some blood and crystals in the urine.  Prescription will be sent to his pharmacy for antibiotics to take for 7 days.  Please make sure that he is drinking at least 4 to 8 glasses of water daily to help flush out crystals and prevent kidney stone formation.  Try to limit salt in the foods.  Once you have completed the antibiotics, please return to the lab for repeat urinalysis. Kidney function test is okay. Liver function test is normal. Blood cell count is okay.

## 2023-02-23 NOTE — Telephone Encounter (Signed)
  Chief Complaint: Results Symptoms: NA Frequency: NA Pertinent Negatives: Patient denies NA Disposition: [] ED /[] Urgent Care (no appt availability in office) / [] Appointment(In office/virtual)/ []  [facility]/ [] Home Care/ [] Refused Recommended Disposition /[] [facility]/ []  Follow-up with PCP Additional Notes:  Result note read to pt, verbalizes understanding.   please let him know that urine was consistent with urinary tract infection.  He has some blood and crystals in the urine.  Prescription will be sent to his pharmacy for antibiotics to take for 7 days.  Please make sure that he is drinking at least 4 to 8 glasses of water daily to help flush out crystals and prevent kidney stone formation.  Try to limit salt in the foods.  Once you have completed the antibiotics, please return to the lab for repeat urinalysis. Kidney function test is okay. Liver function test is normal. Blood cell count is okay.

## 2023-03-03 ENCOUNTER — Telehealth: Payer: Self-pay

## 2023-03-03 NOTE — Telephone Encounter (Signed)
Copied from CRM [id number]. Topic: General - Inquiry >> Mar 03, 2023 12:00 PM Phill Myron wrote: It patient chart it says that the "Notes successfully faxed to [facility] on 02/22/2023.per Johna Roles, CMA" ... Brayden with [facility] stated they were not received, can you please refax to # [phone].. Thank you

## 2023-03-03 NOTE — Telephone Encounter (Signed)
Office notes successfully refaxed to [facility] [phone] on 03/03/2023.

## 2023-03-04 ENCOUNTER — Other Ambulatory Visit: Payer: 59

## 2023-03-16 NOTE — Telephone Encounter (Signed)
Called [facility] on 03/16/2023 and spoke to [place] A. Minus Liberty stated that office notes were received on 03/03/2023. No further action needed at this time.

## 2023-03-18 ENCOUNTER — Other Ambulatory Visit: Payer: 59

## 2023-03-24 ENCOUNTER — Ambulatory Visit: Payer: Medicaid Other | Admitting: Internal Medicine

## 2023-03-25 ENCOUNTER — Ambulatory Visit: Payer: 59 | Attending: Family Medicine

## 2023-03-25 DIAGNOSIS — N390 Urinary tract infection, site not specified: Secondary | ICD-10-CM

## 2023-03-26 LAB — URINALYSIS, ROUTINE W REFLEX MICROSCOPIC
Bilirubin, UA: NEGATIVE
Glucose, UA: NEGATIVE
Ketones, UA: NEGATIVE
Nitrite, UA: NEGATIVE
Specific Gravity, UA: 1.009 (ref 1.005–1.030)
Urobilinogen, Ur: 0.2 mg/dL (ref 0.2–1.0)
pH, UA: 5.5 (ref 5.0–7.5)

## 2023-03-26 LAB — MICROSCOPIC EXAMINATION
Bacteria, UA: NONE SEEN
Casts: NONE SEEN /LPF
WBC, UA: >30 /HPF — AB (ref 0–5)

## 2023-03-27 ENCOUNTER — Telehealth: Payer: Self-pay | Admitting: Internal Medicine

## 2023-03-27 DIAGNOSIS — R3129 Other microscopic hematuria: Secondary | ICD-10-CM

## 2023-03-27 NOTE — Telephone Encounter (Signed)
Phone call placed to patient today to discuss urinalysis results.  I inquired whether he had taken the full 7 days of the ciprofloxacin before coming to have the repeat urinalysis.  Patient states that he finished his last dose on 03/25/2023 which was the day that he came to have the urinalysis done.  He confirmed that he took it as 1 tablet twice a day for 7 days.  Denies any dysuria.  Thinks he saw a little blood in the urine on yesterday. Pete urinalysis shows that he still has a lot of WBCs but RBCs have significantly decreased.  Positive leukocytes but no nitrates.  Will refer to urology for microscopic and gross hematuria.

## 2023-04-13 ENCOUNTER — Ambulatory Visit: Payer: 59 | Admitting: Urology

## 2023-04-13 NOTE — Progress Notes (Deleted)
   Assessment: 1. Microscopic hematuria      Plan: I personally reviewed the patient's chart including provider notes, and lab results.   Chief Complaint: No chief complaint on file.   History of Present Illness:  Rickey Fisher is a 47 y.o. male who is seen in consultation from [name], MD for evaluation of hematuria.   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 left convex lumbar     Past Surgical History:  Past Surgical History:  Procedure Laterality Date   COLON SURGERY     COLOSTOMY     As a baby, does not know why\    Allergies:  Allergies  Allergen Reactions   Banana Anaphylaxis   Onion Anaphylaxis    Raw   Other Anaphylaxis    RASPBERRIES   Peach [Prunus Persica] Anaphylaxis   Shellfish Allergy Anaphylaxis   Tomato Anaphylaxis   Bee Pollen Itching   Pollen Extract Itching    Family History:  Family History  Problem Relation Age of Onset   Lupus Father    Hypertension Mother     Social History:  Social History   Tobacco Use   Smoking status: Every Day    Current packs/day: 0.10    Types: Cigarettes   Smokeless tobacco: Never  Substance Use Topics   Alcohol use: No   Drug use: Yes    Types: Marijuana    Review of symptoms:  Constitutional:  Negative for unexplained weight loss, night sweats, fever, chills ENT:  Negative for nose bleeds, sinus pain, painful swallowing CV:  Negative for chest pain, shortness of breath, exercise intolerance, palpitations, loss of consciousness Resp:  Negative for cough, wheezing, shortness of breath GI:  Negative for nausea, vomiting, diarrhea, bloody stools GU:  Positives noted in HPI; otherwise negative for gross hematuria, dysuria, urinary incontinence Neuro:  Negative for seizures, poor balance, limb weakness, slurred speech Psych:   Negative for lack of energy, depression, anxiety Endocrine:  Negative for polydipsia, polyuria, symptoms of hypoglycemia (dizziness, hunger, sweating) Hematologic:  Negative for anemia, purpura, petechia, prolonged or excessive bleeding, use of anticoagulants  Allergic:  Negative for difficulty breathing or choking as a result of exposure to anything; no shellfish allergy; no allergic response (rash/itch) to materials, foods  Physical exam: There were no vitals taken for this visit. GENERAL APPEARANCE:  Well appearing, well developed, well nourished, NAD HEENT: Atraumatic, Normocephalic, oropharynx clear. NECK: Supple without lymphadenopathy or thyromegaly. LUNGS: Clear to auscultation bilaterally. HEART: Regular Rate and Rhythm without murmurs, gallops, or rubs. ABDOMEN: Soft, non-tender, No Masses. EXTREMITIES: Moves all extremities well.  Without clubbing, cyanosis, or edema. NEUROLOGIC:  Alert and oriented x 3, normal gait, CN II-XII grossly intact.  MENTAL STATUS:  Appropriate. BACK:  Non-tender to palpation.  No CVAT SKIN:  Warm, dry and intact.    Results: No results found for this or any previous visit (from the past 24 hour(s)).

## 2023-04-19 ENCOUNTER — Encounter: Payer: Self-pay | Admitting: Internal Medicine

## 2023-05-02 ENCOUNTER — Ambulatory Visit (AMBULATORY_SURGERY_CENTER): Payer: 59

## 2023-05-02 VITALS — Ht 75.0 in | Wt 139.0 lb

## 2023-05-02 DIAGNOSIS — Z1211 Encounter for screening for malignant neoplasm of colon: Secondary | ICD-10-CM

## 2023-05-02 MED ORDER — PEG 3350-KCL-NA BICARB-NACL 420 G PO SOLR: 4000.0000 mL | Freq: Once | ORAL | 0 refills | Status: AC

## 2023-05-02 NOTE — Progress Notes (Signed)
No egg or soy allergy known to patient  No issues known to pt with past sedation with any surgeries or procedures Patient denies ever being told they had issues or difficulty with intubation  No FH of Malignant Hyperthermia Pt is not on diet pills Pt is not on  home 02  Pt is not on blood thinners  Pt denies issues with constipation - Pt has ileotomy No A fib or A flutter Have any cardiac testing pending--no LOA: independent  Prep: Golytely   Patient's chart reviewed by Cathlyn Parsons CNRA prior to previsit and patient appropriate for the [facility].  Previsit completed and red dot placed by patient's name on their procedure day (on provider's schedule).     PV competed with patient. Prep instructions sent via mychart and home address.

## 2023-05-23 ENCOUNTER — Encounter: Payer: Self-pay | Admitting: Internal Medicine

## 2023-05-23 ENCOUNTER — Ambulatory Visit (AMBULATORY_SURGERY_CENTER): Payer: 59 | Admitting: Internal Medicine

## 2023-05-23 VITALS — BP 124/83 | HR 56 | Temp 96.3°F | Resp 22 | Ht 75.0 in | Wt 139.0 lb

## 2023-05-23 DIAGNOSIS — Z1211 Encounter for screening for malignant neoplasm of colon: Secondary | ICD-10-CM

## 2023-05-23 MED ORDER — SODIUM CHLORIDE 0.9 % IV SOLN: 500.0000 mL | INTRAVENOUS | Status: DC

## 2023-05-23 NOTE — Patient Instructions (Signed)
Resume all of your previous medications today as ordered.  Read your discharge instructions.  YOU HAD AN ENDOSCOPIC PROCEDURE TODAY AT THE [facility]:   Refer to the procedure report that was given to you for any specific questions about what was found during the examination.  If the procedure report does not answer your questions, please call your gastroenterologist to clarify.  If you requested that your care partner not be given the details of your procedure findings, then the procedure report has been included in a sealed envelope for you to review at your convenience later.  YOU SHOULD EXPECT: Some feelings of bloating in the abdomen. Passage of more gas than usual.  Walking can help get rid of the air that was put into your GI tract during the procedure and reduce the bloating. If you had a lower endoscopy (such as a colonoscopy or flexible sigmoidoscopy) you may notice spotting of blood in your stool or on the toilet paper. If you underwent a bowel prep for your procedure, you may not have a normal bowel movement for a few days.  Please Note:  You might notice some irritation and congestion in your nose or some drainage.  This is from the oxygen used during your procedure.  There is no need for concern and it should clear up in a day or so.  SYMPTOMS TO REPORT IMMEDIATELY:  Following lower endoscopy (colonoscopy or flexible sigmoidoscopy):  Excessive amounts of blood in the stool  Significant tenderness or worsening of abdominal pains  Swelling of the abdomen that is new, acute  Fever of 100F or higher   For urgent or emergent issues, a gastroenterologist can be reached at any hour by calling [phone]. Do not use MyChart messaging for urgent concerns.    DIET:  We do recommend a small meal at first, but then you may proceed to your regular diet.  Drink plenty of fluids but you should avoid alcoholic beverages for 24 hours.  ACTIVITY:  You should plan to take it easy  for the rest of today and you should NOT DRIVE or use heavy machinery until tomorrow (because of the sedation medicines used during the test).    FOLLOW UP: Our staff will call the number listed on your records the next business day following your procedure.  We will call around 7:15- 8:00 am to check on you and address any questions or concerns that you may have regarding the information given to you following your procedure. If we do not reach you, we will leave a message.       SIGNATURES/CONFIDENTIALITY: You and/or your care partner have signed paperwork which will be entered into your electronic medical record.  These signatures attest to the fact that that the information above on your After Visit Summary has been reviewed and is understood.  Full responsibility of the confidentiality of this discharge information lies with you and/or your care-partner.

## 2023-05-23 NOTE — Op Note (Signed)
[facility] Patient Name: Rickey Fisher Procedure Date: 05/23/2023 2:56 PM MRN: [id number] Endoscopist: Madelyn Brunner [place] , , [id number] Age: 47 Referring MD:  Date of Birth: 1975-08-11 Gender: Male Account #: [id number] Procedure:                Colonoscopy Indications:              Screening for colorectal malignant neoplasm, This                            is the patient's first colonoscopy Medicines:                Monitored Anesthesia Care Procedure:                Pre-Anesthesia Assessment:                           - Prior to the procedure, a History and Physical                            was performed, and patient medications and                            allergies were reviewed. The patient's tolerance of                            previous anesthesia was also reviewed. The risks                            and benefits of the procedure and the sedation                            options and risks were discussed with the patient.                            All questions were answered, and informed consent                            was obtained. Prior Anticoagulants: The patient has                            taken no anticoagulant or antiplatelet agents. ASA                            Grade Assessment: III - A patient with severe                            systemic disease. After reviewing the risks and                            benefits, the patient was deemed in satisfactory                            condition to undergo the procedure.  After obtaining informed consent, the colonoscope                            was passed under direct vision. Throughout the                            procedure, the patient's blood pressure, pulse, and                            oxygen saturations were monitored continuously. The                            [id number]-HQ190L Colonoscope [id number] was introduced                            through the ileostomy  and advanced to the the                            terminal ileum. Then the colon was introduced                            through the anus into the rectum. The colonoscopy                            was performed without difficulty. The patient                            tolerated the procedure well. The quality of the                            bowel preparation was good. The terminal ileum and                            the rectum were photographed. Rectal retroflexion                            was not able to be performed. 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 This was characterized by friable                            mucosa. This was traversed.                           The terminal ileum through the ilestomy appeared                            normal.  Solid stool was found in the rectum initially. This                            was able to be removed with fecal disimpaction.                           The rectum through his anus appeared normal. Complications:            No immediate complications. Estimated Blood Loss:     Estimated blood loss: none. 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                           - The examined portion of the ileum was normal.                           - Stool in the rectum. Removed.                           - The rectum is normal.                           - No specimens collected. Recommendation:           - Discharge patient to home (with escort).                           - Repeat flexible sigmoidoscopy in 10 years for                            screening purposes. Patient will only need to do                            two enemas to clean out his rectal vault  and                            flexible sigmoidoscopy was only be performed                            through the anus. No need to drink full colonoscopy                            prep.                           - The findings and recommendations were discussed                            with the patient. Dr Particia Lather "Alan Ripper" Leonides Schanz,  05/23/2023 3:40:19 PM

## 2023-05-23 NOTE — Progress Notes (Signed)
GASTROENTEROLOGY PROCEDURE H&P NOTE   Primary Care Physician: [name], MD    Reason for Procedure:   Colon cancer screening  Plan:    Colonoscopy  Patient is appropriate for endoscopic procedure(s) in the ambulatory ([facility]) setting.  The nature of the procedure, as well as the risks, benefits, and alternatives were carefully and thoroughly reviewed with the patient. Ample time for discussion and questions allowed. The patient understood, was satisfied, and agreed to proceed.     HPI: Rickey Fisher is a 47 y.o. male who presents for colonoscopy for colon cancer screening. Denies blood in stools, changes in bowel habits, or unintentional weight loss. Denies family history of colon cancer. 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 He was told that he does not have enough colon to reconnect to his anus. He is does not pass any stool from his anus. If he had a colonoscopy in the past, it was done when he was a child.   Past Medical History:  Diagnosis Date   Cholelithiasis    CT abd 06/2010   Congenital anomaly of intestine September 22, 1975   Lumbar scoliosis 06/2010   noted on CT abdomen 06/2010. left convex lumbar     Past Surgical History:  Procedure Laterality Date   COLON SURGERY     COLOSTOMY     As a baby, does not know why\    Prior to Admission medications   Medication Sig Start Date End Date Taking? Authorizing Provider  EPINEPHrine 0.3 mg/0.3 mL IJ SOAJ injection Inject 0.3 mg into the muscle as needed for anaphylaxis. 02/22/23   [name], MD    Current Outpatient Medications  Medication Sig Dispense Refill   EPINEPHrine 0.3 mg/0.3 mL IJ SOAJ injection Inject 0.3 mg into the muscle as needed for anaphylaxis. 1 each 1   Current Facility-Administered Medications  Medication Dose Route Frequency Provider Last Rate Last Admin   0.9 %  sodium chloride infusion  500 mL Intravenous Continuous Imogene Burn, MD        Allergies as of 05/23/2023 - Review Complete 05/23/2023  Allergen Reaction Noted   Banana Anaphylaxis 05/07/2016   Onion Anaphylaxis 12/03/2016   Other Anaphylaxis 11/18/2016   Peach [prunus persica] Anaphylaxis 01/14/2022   Shellfish allergy Anaphylaxis 01/05/2012   Tomato Anaphylaxis 05/07/2016   Bee pollen Itching 05/07/2016   Pollen extract Itching 05/07/2016    Family History  Problem Relation Age of Onset   Hypertension Mother    Lupus Father    Colon cancer Neg Hx    Esophageal cancer Neg Hx    Rectal cancer Neg Hx    Stomach cancer Neg Hx     Social History   Socioeconomic History   Marital status: Single    Spouse name: Not on file   Number of children: 0   Years of education: GED   Highest education level: Not on file  Occupational History    Employer: NOT EMPLOYED  Tobacco Use   Smoking status: Some Days    Current packs/day: 0.10    Types: Cigarettes   Smokeless tobacco: Never  Vaping Use   Vaping status: Never Used  Substance and Sexual Activity   Alcohol use: No   Drug use: Yes    Types: Marijuana   Sexual activity: Never  Other Topics Concern   Not on file  Social History Narrative   Worked at [employer]; Tourist information centre manager  up in [place]; Went to [employer], dropped out in 11th grade,but acquired GED   [place]   Has 3 brothers, 1 sister - all in [place]   Mom & Dad in [place] as well.   Social Determinants of Health   Financial Resource Strain: Not on file  Food Insecurity: Not on file  Transportation Needs: Not on file  Physical Activity: Not on file  Stress: Not on file  Social Connections: Not on file  Intimate Partner Violence: Not on file    Physical Exam: Vital signs in last 24 hours: BP 138/85   Pulse 72   Temp (!) 96.3 F (35.7 C)   Ht 6\' 3"  (1.905 m)   Wt 139 lb (63 kg)   SpO2 100%   BMI 17.37 kg/m  GEN: NAD EYE: Sclerae anicteric ENT: MMM CV:  Non-tachycardic Pulm: No increased work of breathing GI: Soft, NT/ND NEURO:  Alert & Oriented   Eulah Pont, MD [facility]  05/23/2023 2:09 PM

## 2023-05-23 NOTE — Progress Notes (Signed)
Report to PACU, RN, vss, BBS= Clear.  

## 2023-05-24 ENCOUNTER — Telehealth: Payer: Self-pay

## 2023-05-24 NOTE — Telephone Encounter (Signed)
Follow up call to pt, lm for pt to call if having any difficulty with normal activities or eating and drinking.  Also to call if any other questions or concerns.  

## 2023-08-05 ENCOUNTER — Telehealth: Payer: Self-pay

## 2023-08-05 NOTE — Telephone Encounter (Signed)
Copied from CRM [id number]. Topic: General - Other >> Aug 05, 2023  4:05 PM Marlow Baars wrote: Reason for CRM: John with [employer] the patients ostomy resupply company called in wanting to let the provider know he is going to refax over an order for her to sign and reorder new supplies. Please assist patient further by faxing order to [phone]

## 2023-08-11 NOTE — Telephone Encounter (Signed)
 Provider is currently out of the office. Awaiting for her return. Forms were received and placed on provider box for signature.

## 2023-08-17 NOTE — Telephone Encounter (Signed)
 Call noted providers has forms per CMA

## 2023-08-17 NOTE — Telephone Encounter (Signed)
 Rickey Fisher with [employer] called inquiring about the forms for the ostomy supplies that were received and placed in the providers box. Please assist further

## 2023-08-18 NOTE — Telephone Encounter (Signed)
 Forms successfully faxed back to [employer] on 08/18/2023.

## 2023-08-22 ENCOUNTER — Telehealth: Payer: Self-pay

## 2023-08-22 ENCOUNTER — Ambulatory Visit: Admitting: Internal Medicine

## 2023-08-22 NOTE — Telephone Encounter (Signed)
 Copied from CRM [id number]. Topic: General - Other >> Aug 22, 2023  8:49 AM Everette C wrote: Reason for CRM: John with [employer] has called to request the resubmission of a prescription for patient's ostomy supplies   Please contact John via phone at [phone] or via fax at [phone]

## 2023-08-23 NOTE — Telephone Encounter (Signed)
 Ostomy form successfully re-faxed to 08/23/2023. Fax number [phone].

## 2023-08-25 ENCOUNTER — Other Ambulatory Visit: Payer: Self-pay

## 2023-08-25 ENCOUNTER — Telehealth: Payer: Self-pay

## 2023-08-25 ENCOUNTER — Ambulatory Visit: Payer: 59 | Attending: Internal Medicine | Admitting: Internal Medicine

## 2023-08-25 VITALS — BP 118/82 | HR 74 | Temp 98.1°F | Ht 75.0 in | Wt 150.0 lb

## 2023-08-25 DIAGNOSIS — T23172A Burn of first degree of left wrist, initial encounter: Secondary | ICD-10-CM | POA: Diagnosis not present

## 2023-08-25 DIAGNOSIS — Z77120 Contact with and (suspected) exposure to mold (toxic): Secondary | ICD-10-CM

## 2023-08-25 DIAGNOSIS — F330 Major depressive disorder, recurrent, mild: Secondary | ICD-10-CM | POA: Diagnosis not present

## 2023-08-25 DIAGNOSIS — Z599 Problem related to housing and economic circumstances, unspecified: Secondary | ICD-10-CM | POA: Diagnosis not present

## 2023-08-25 DIAGNOSIS — Z932 Ileostomy status: Secondary | ICD-10-CM

## 2023-08-25 DIAGNOSIS — Z23 Encounter for immunization: Secondary | ICD-10-CM

## 2023-08-25 MED ORDER — SILVER SULFADIAZINE 1 % EX CREA
1.0000 | TOPICAL_CREAM | Freq: Every day | CUTANEOUS | 0 refills | Status: AC
  Filled 2023-08-25: qty 50, 30d supply, fill #0

## 2023-08-25 NOTE — Telephone Encounter (Signed)
Rickey Fisher with [employer] called back and stated that they are only receiving the coversheet. Please advise. Rickey Fisher [phone]

## 2023-08-25 NOTE — Progress Notes (Signed)
Patient ID: Rickey Fisher, male    DOB: Aug 01, 1976  MRN: [id number]  CC: Follow-up (Follow-up. Franchot Erichsen patient weight /Discuss pneumonia vax. Yes to Tdap vax. )   Subjective: Rickey Fisher is a 48 y.o. male who presents for chronic ds management. His concerns today include:  Pt with congenital abn of intestine, ileostomy present, lumbar scoliosis, tob dep, MDD   Discussed the use of AI scribe software for clinical note transcription with the patient, who gave verbal consent to proceed.  History of Present Illness   The patient, with an ileostomy, reports issues with the ileostomy site due to a change in the type of bag provided. The new bag leaks at the top at times, causing discomfort and skin irritation. Despite reporting this to the medical supplier, he was told there is nothing that can be done due to the unavailability of the previous bags that he was getting; apparently they stopped making them. .  In addition, the patient has been struggling with weight loss. He has gained some weight since the last visit, moving from 139 pounds to 150 pounds, with BMI now within the low normal range for his height. He has been trying to eat three meals a day, but his appetite is affected by his coffee consumption. He has noticed that drinking coffee in the morning suppresses his appetite, leading to him sometimes only eating once a day. He has been trying to wean himself off coffee to improve his eating habits.  The patient is also dealing with significant stress due to financial difficulties. He is behind 2 mths on rent and utility bills and is at risk of eviction.  He is on SSI and gets $940 a month.  His rent is $850 a month.  He does get food stamps.  He is struggling to afford basic necessities like laundry and is finding it hard to access resources for help.  This is the first time that he has had an apartment to himself and is in risk of losing it having had it for less than a year.  Place is in  need of repair.  Lot of cold air getting under the door and there is some mold around the windows.  He has reported to his landlord.  This stress is contributing to his elevated depression score.     Reports he sustained a burn to his left wrist several days ago while pouring coffee.  States he was not paying attention and accidentally poured home on his left wrist.  He has been using some antibiotic cream  Patient Active Problem List   Diagnosis Date Noted   Food allergy 02/22/2023   Tobacco dependence 02/22/2023   Major depressive disorder, single episode, mild (HCC) 02/22/2023   Weakness 07/13/2017   Status post lumbar spinal fusion 07/13/2017   Left lumbar radiculitis 09/07/2016   Other idiopathic scoliosis, thoracolumbar region 07/13/2016   Poor dentition 05/20/2016   Onychomycosis of toenail 05/20/2016   Tinea pedis 05/20/2016   Anaphylactic shock due to adverse food reaction 05/20/2016   Lumbar spine scoliosis 05/07/2016   Ileostomy present (HCC) 04/07/2012   Congenital anomaly of intestine [birth year]     Current Outpatient Medications on File Prior to Visit  Medication Sig Dispense Refill   EPINEPHrine 0.3 mg/0.3 mL IJ SOAJ injection Inject 0.3 mg into the muscle as needed for anaphylaxis. 1 each 1   No current facility-administered medications on file prior to visit.    Allergies  Allergen Reactions  Banana Anaphylaxis   Onion Anaphylaxis    Raw   Other Anaphylaxis    RASPBERRIES   Peach [Prunus Persica] Anaphylaxis   Shellfish Allergy Anaphylaxis   Tomato Anaphylaxis   Bee Pollen Itching   Pollen Extract Itching    Social History   Socioeconomic History   Marital status: Single    Spouse name: Not on file   Number of children: 0   Years of education: GED   Highest education level: Not on file  Occupational History    Employer: NOT EMPLOYED  Tobacco Use   Smoking status: Some Days    Current packs/day: 0.10    Types: Cigarettes   Smokeless tobacco:  Never  Vaping Use   Vaping status: Never Used  Substance and Sexual Activity   Alcohol use: No   Drug use: Yes    Types: Marijuana   Sexual activity: Never  Other Topics Concern   Not on file  Social History Narrative   Worked at [employer]; Grew up in [place]; Went to [employer], dropped out in 11th grade,but acquired GED   Lives with friends/family [place]"   Has 3 brothers, 1 sister - all in [place]   Mom & Dad in [place] as well.   Social Drivers of Corporate investment banker Strain: High Risk (08/25/2023)   Overall Financial Resource Strain (CARDIA)    Difficulty of Paying Living Expenses: Very hard  Food Insecurity: Food Insecurity Present (08/25/2023)   Hunger Vital Sign    Worried About Running Out of Food in the Last Year: Sometimes true    Ran Out of Food in the Last Year: Sometimes true  Transportation Needs: No Transportation Needs (08/25/2023)   PRAPARE - Administrator, Civil Service (Medical): No    Lack of Transportation (Non-Medical): No  Physical Activity: Insufficiently Active (08/25/2023)   Exercise Vital Sign    Days of Exercise per Week: 3 days    Minutes of Exercise per Session: 30 min  Stress: Stress Concern Present (08/25/2023)   [employer] of Occupational Health - Occupational Stress Questionnaire    Feeling of Stress : To some extent  Social Connections: Socially Isolated (08/25/2023)   Social Connection and Isolation Panel [NHANES]    Frequency of Communication with Friends and Family: Never    Frequency of Social Gatherings with Friends and Family: Never    Attends Religious Services: More than 4 times per year    Active Member of [employer] or Organizations: No    Attends Banker Meetings: Never    Marital Status: Never married  Intimate Partner Violence: Not At Risk (08/25/2023)   Humiliation, Afraid, Rape, and Kick questionnaire    Fear of Current or Ex-Partner: No    Emotionally Abused: No     Physically Abused: No    Sexually Abused: No    Family History  Problem Relation Age of Onset   Hypertension Mother    Lupus Father    Colon cancer Neg Hx    Esophageal cancer Neg Hx    Rectal cancer Neg Hx    Stomach cancer Neg Hx     Past Surgical History:  Procedure Laterality Date   COLON SURGERY     COLOSTOMY     As a baby, does not know why\    ROS: Review of Systems Negative except as stated above  PHYSICAL EXAM: BP 118/82 (BP Location: Left Arm, Patient Position: Sitting, Cuff Size:  Normal)   Pulse 74   Temp 98.1 F (36.7 C) (Oral)   Ht 6\' 3"  (1.905 m)   Wt 150 lb (68 kg)   SpO2 99%   BMI 18.75 kg/m   Wt Readings from Last 3 Encounters:  08/25/23 150 lb (68 kg)  05/23/23 139 lb (63 kg)  05/02/23 139 lb (63 kg)    Physical Exam   General appearance - alert, well appearing, and in no distress Mental status -patient with pressured speech.  Seems very stressed. Chest - clear to auscultation, no wheezes, rales or rhonchi, symmetric air entry Heart - normal rate, regular rhythm, normal S1, S2, no murmurs, rubs, clicks or gallops Abdomen -ileostomy site appears clean.  No signs of infection or skin irritation at this time. Extremities -no lower extremity edema Left wrist palmar surface: Mild burn mark about 4 cm in greatest diameter.  No blistering seen.    08/25/2023   11:43 AM 02/22/2023   11:22 AM 01/14/2022    2:19 PM  Depression screen PHQ 2/9  Decreased Interest 1 2 1   Down, Depressed, Hopeless 3 2 0  PHQ - 2 Score 4 4 1   Altered sleeping 2 2 0  Tired, decreased energy 2 2 0  Change in appetite 0 1 1  Feeling bad or failure about yourself  2 2 0  Trouble concentrating 0 2 0  Moving slowly or fidgety/restless 2 0 1  Suicidal thoughts 1 0 0  PHQ-9 Score 13 13 3   Difficult doing work/chores Somewhat difficult  Not difficult at all      08/25/2023   11:43 AM 02/22/2023   11:22 AM 09/25/2020    2:28 PM 05/27/2020    2:15 PM  GAD 7 :  Generalized Anxiety Score  Nervous, Anxious, on Edge 1 2 0 0  Control/stop worrying 2 1 1  0  Worry too much - different things 2 2 1  0  Trouble relaxing 2 2 1  0  Restless 1 1 1  0  Easily annoyed or irritable 2 2 0 0  Afraid - awful might happen 2 0 0 0  Total GAD 7 Score 12 10 4  0  Anxiety Difficulty Very difficult            Latest Ref Rng & Units 02/22/2023   12:14 PM 09/25/2020    3:19 PM 06/28/2018   12:24 PM  CMP  Glucose 70 - 99 mg/dL 81  95  73   BUN 6 - 24 mg/dL 7  8  7    Creatinine 0.76 - 1.27 mg/dL 1.61  0.96  0.45   Sodium 134 - 144 mmol/L 139  145  139   Potassium 3.5 - 5.2 mmol/L 4.1  3.9  3.8   Chloride 96 - 106 mmol/L 103  103  103   CO2 20 - 29 mmol/L 24  21  21    Calcium 8.7 - 10.2 mg/dL 9.4  9.3  9.3   Total Protein 6.0 - 8.5 g/dL 7.1   7.0   Total Bilirubin 0.0 - 1.2 mg/dL 0.4   0.9   Alkaline Phos 44 - 121 IU/L 74   61   AST 0 - 40 IU/L 15   29   ALT 0 - 44 IU/L 10   29    Lipid Panel     Component Value Date/Time   CHOL 139 06/28/2018 1224   TRIG 61 06/28/2018 1224   HDL 78 06/28/2018 1224   CHOLHDL 1.8 06/28/2018 1224  LDLCALC 49 06/28/2018 1224    CBC    Component Value Date/Time   WBC 7.9 02/22/2023 1214   WBC 9.1 05/20/2016 1600   RBC 5.10 02/22/2023 1214   RBC 5.53 05/20/2016 1600   HGB 12.4 (L) 02/22/2023 1214   HCT 41.3 02/22/2023 1214   PLT 286 02/22/2023 1214   MCV 81 02/22/2023 1214   MCH 24.3 (L) 02/22/2023 1214   MCH 24.6 (L) 05/20/2016 1600   MCHC 30.0 (L) 02/22/2023 1214   MCHC 30.6 (L) 05/20/2016 1600   RDW 13.6 02/22/2023 1214   LYMPHSABS 1.7 09/25/2020 1519   MONOABS 0.7 07/03/2010 0210   EOSABS 0.3 09/25/2020 1519   BASOSABS 0.0 09/25/2020 1519    ASSESSMENT AND PLAN: 1. Ileostomy present (HCC) (Primary) -Discussed trying to get him different bags.  However patient states that the previous bag that he used to use is no longer being made due to material in the adhesive was found to be a danger/health risk.  He  feels he can manage with the current bags that he is getting Commended him on weight gain.  Encouraged him to continue trying to get into 3 meals a day.  He is cutting back on the coffee consumption. 2. Financial difficulties This is his major issue today.  I did have our caseworker speak with him to see if she can help him brainstorm about accessing resources in the community to help with rent and utility payments  3. Major depressive disorder, recurrent episode, mild (HCC) Patient does not feel that he needs to be on any medication at this time but is agreeable to some counseling. - Ambulatory referral to Psychiatry  4. Mold exposure Encouraged him to stay behind his landlord to get this situation remedied  5. Superficial burn of left wrist, initial encounter Does not appear severe.  We will give some Silvadene cream to use daily until it completely heals - silver sulfADIAZINE (SILVADENE) 1 % cream; Apply 1 Application topically daily.  Dispense: 50 g; Refill: 0  6. Encounter for immunization - Tdap vaccine greater than or equal to 7yo IM   Patient was given the opportunity to ask questions.  Patient verbalized understanding of the plan and was able to repeat key elements of the plan.   This documentation was completed using Paediatric nurse.  Any transcriptional errors are unintentional.  Orders Placed This Encounter  Procedures   Tdap vaccine greater than or equal to 7yo IM   Ambulatory referral to Psychiatry     Requested Prescriptions   Signed Prescriptions Disp Refills   silver sulfADIAZINE (SILVADENE) 1 % cream 50 g 0    Sig: Apply 1 Application topically daily.    No follow-ups on file.  Jonah Blue, MD, FACP

## 2023-08-25 NOTE — Telephone Encounter (Signed)
At the request of Dr Laural Benes, I met with the patient when he was in the clinic today for his appointment. He currently receives about $940/month SSI and he also receives food stamps.  He said that his rent is $850/month and he also pays electricity.  He explained that he has been in the apartment about a year and is very pleased that he was able to secure a place of his own. He feels safe and is very happy with the location he is living in but the rent is $850/month and he is not able to afford it any longer. He said that he received an eviction notice and needs to be in court on 09/12/2023. He really does not want to move but understands he does not have much of an option with the rent being so high.  He was in agreement to placing a referral to [facility] for assistance with finding other housing options that are more affordable.   That referral was placed as discussed.  I also explained to him that I can refer him to [employer] [facility] [place] for possible assistance with mediating the eviction.  I told him that there is no guarantee that they can help we can try.  He was in agreement and that referral was placed as discussed.  His current property management company is : [employer]): [phone].  He then went on to say that the apartment is moldy and there is no seal around the doors and air continuously leaks into the home through large gaps in the door frame. He said he has notified the landlord of these concerns and nothing has been done about it.  I told him that I will notify Legal [employer] of those concerns as well.   Regarding transportation concerns. He said he is aware of the transportation service offered by his insurance company but he doesn't always have time to give a 2-3 day notice for the ride and he prefers to use the city bus.

## 2023-08-25 NOTE — Telephone Encounter (Signed)
Forms successfully re-faxed on 08/25/2023. Of note, previous faxes have given a confirmation sheet verifying all sheets were successfully faxed.

## 2023-08-30 NOTE — Telephone Encounter (Signed)
Message received from [facility], [facility] stating they are assisting the patient.

## 2023-08-31 ENCOUNTER — Other Ambulatory Visit: Payer: Self-pay | Admitting: Licensed Clinical Social Worker

## 2023-08-31 NOTE — Patient Instructions (Signed)
Visit Information  [name] was given information about Medicaid Managed Care team care coordination services as a part of their [facility] Community Plan Medicaid benefit. Rickey Fisher verbally consented to engagement with the [facility] Managed Care team.   If you are experiencing a medical emergency, please call 911 or report to your local emergency department or urgent care.   If you have a non-emergency medical problem during routine business hours, please contact your provider's office and ask to speak with a nurse.   For questions related to your [facility], please call: [phone] or visit the homepage here: [email or web address]  If you would like to schedule transportation through your [facility], please call the following number at least 2 days in advance of your appointment: [phone]   Rides for urgent appointments can also be made after hours by calling Member Services.  Call the Behavioral Health Crisis Line at [phone], at any time, 24 hours a day, 7 days a week. If you are in danger or need immediate medical attention call 911.  If you would like help to quit smoking, call 1-800-QUIT-NOW (717-656-3093) OR Espaol: 1-855-Djelo-Ya (1-324-401-0272) o para ms informacin haga clic aqu or Text READY to 536-644 to register via text  Following is a copy of your plan of care:  Care Plan : LCSW Plan of Care  Updates made by Rickey Bryant, LCSW since 08/31/2023 12:00 AM     Problem: Coping Skills (General Plan of Care)      Goal: Coping Skills Enhanced   Note:    Timeframe:  Short Term Range Goal Priority:  High Start Date:    08/31/23 Expected End Date:  ongoing                     Follow Up Date--09/16/23 at 11 am  Current barriers:   SDOH Barriers Need for housing resources Need for dental resources Need for Financial Assistance and connection to  available community resources Desire to start Rickey Fisher treatment (MDD dx)  Clinical Goals: Patient will work with agencies/resources/PCP/[facility] LCSW to address needs related to gaining behavioral health and housing resources to help promote the safety and health of patient.  Patient Goals/Self-Care Activities: Call provider office for new concerns or questions Consider bumping up self-care  Call [phone] in case of an emergency      24- Hour Availability:    [facility]  [address] [place], [place] [phone] Crisis [phone]   [facility] [phone]  [place] [facility]  [phone]    [facility]  [phone] (after hours)   Therapeutic Alternative/Mobile Crisis   [phone]   [place] National Suicide Hotline  563-649-0752 Len Childs) [place] [address]   Call [phone] for mental health emergencies   [facility]  [phone]);  [place] and [employer]  [phone]); [place], [place], [place], [place], [place], [place], [place]    [facility] for [name] For 24/7 walk-up access to mental health services for [facility] children (4+), adolescents and adults, please visit the [facility] located at [address] in [place], [place].  *[name] also provides comprehensive outpatient behavioral health services in a variety of locations around the [place].  Connect With [place] [address] [place], [place] [address] HelpLine: (219)364-9084 or 1-(604)284-0013  Get Directions  Find Help 24/7 By Phone Call  our 24-hour HelpLine at (315)172-9588 or 450-081-6798 for immediate assistance for mental health and substance abuse issues.  Walk-In Help [place]: [facility] (Ages 4 and Up) Mariano Colon [place]: Emergency Dept., [facility] Additional  Resources National Hopeline Network: 1-800-SUICIDE The National Suicide Prevention Lifeline: 1-800-273-TALK     The following coping skill education was provided for stress relief and mental health management: "When your car dies or a deadline looms, how do you respond? Long-term, low-grade or acute stress takes a serious toll on your body and mind, so don't ignore feelings of constant tension. Stress is a natural part of life. However, too much stress can harm our health, especially if it continues every day. This is chronic stress and can put you at risk for heart problems like heart disease and depression. Understand what's happening inside your body and learn simple coping skills to combat the negative impacts of everyday stressors.  Types of Stress There are two types of stress: Emotional - types of emotional stress are relationship problems, pressure at work, financial worries, experiencing discrimination or having a major life change. Physical - Examples of physical stress include being sick having pain, not sleeping well, recovery from an injury or having an alcohol and drug use disorder. Fight or Flight Sudden or ongoing stress activates your nervous system and floods your bloodstream with adrenaline and cortisol, two hormones that raise blood pressure, increase heart rate and spike blood sugar. These changes pitch your body into a fight or flight response. That enabled our ancestors to outrun saber-toothed tigers, and it's helpful today for situations like dodging a car accident. But most modern chronic stressors, such as finances or a challenging relationship, keep your body in that heightened state, which hurts your health. Effects of Too Much Stress If constantly under stress, most of [place] will eventually start to function less well.  Multiple studies link chronic stress to a higher risk of heart disease, stroke, depression, weight gain, memory loss and even premature death, so it's  important to recognize the warning signals. Talk to your doctor about ways to manage stress if you're experiencing any of these symptoms: Prolonged periods of poor sleep. Regular, severe headaches. Unexplained weight loss or gain. Feelings of isolation, withdrawal or worthlessness. Constant anger and irritability. Loss of interest in activities. Constant worrying or obsessive thinking. Excessive alcohol or drug use. Inability to concentrate.  10 Ways to Cope with Chronic Stress It's key to recognize stressful situations as they occur because it allows you to focus on managing how you react. We all need to know when to close our eyes and take a deep breath when we feel tension rising. Use these tips to prevent or reduce chronic stress. 1. Rebalance Work and Home All work and no play? If you're spending too much time at the office, intentionally put more dates in your calendar to enjoy time for fun, either alone or with others. 2. Get Regular Exercise Moving your body on a regular basis balances the nervous system and increases blood circulation, helping to flush out stress hormones. Even a daily 20-minute walk makes a difference. Any kind of exercise can lower stress and improve your mood ? just pick activities that you enjoy and make it a regular habit. 3. Eat Well and Limit Alcohol and Stimulants Alcohol, nicotine and caffeine may temporarily relieve stress but have negative health impacts and can make stress worse in the long run. Well-nourished bodies cope better, so start with a  good breakfast, add more organic fruits and vegetables for a well-balanced diet, avoid processed foods and sugar, try herbal tea and drink more water. 4. Connect with Supportive People Talking face to face with another person releases hormones that reduce stress. Lean on those good listeners in your life. 5. Carve Out Hobby Time Do you enjoy gardening, reading, listening to music or some other creative pursuit?  Engage in activities that bring you pleasure and joy; research shows that reduces stress by almost half and lowers your heart rate, too. 6. Practice Meditation, Stress Reduction or Yoga Relaxation techniques activate a state of restfulness that counterbalances your body's fight-or-flight hormones. Even if this also means a 10-minute break in a long day: listen to music, read, go for a walk in nature, do a hobby, take a bath or spend time with a friend. Also consider doing a mindfulness exercise or try a daily deep breathing or imagery practice. Deep Breathing Slow, calm and deep breathing can help you relax. Try these steps to focus on your breathing and repeat as needed. Find a comfortable position and close your eyes. Exhale and drop your shoulders. Breathe in through your nose; fill your lungs and then your belly. Think of relaxing your body, quieting your mind and becoming calm and peaceful. Breathe out slowly through your nose, relaxing your belly. Think of releasing tension, pain, worries or distress. Repeat steps three and four until you feel relaxed. Imagery This involves using your mind to excite the senses -- sound, vision, smell, taste and feeling. This may help ease your stress. Begin by getting comfortable and then do some slow breathing. Imagine a place you love being at. It could be somewhere from your childhood, somewhere you vacationed or just a place in your imagination. Feel how it is to be in the place you're imagining. Pay attention to the sounds, air, colors, and who is there with you. This is a place where you feel cared for and loved. All is well. You are safe. Take in all the smells, sounds, tastes and feelings. As you do, feel your body being nourished and healed. Feel the calm that surrounds you. Breathe in all the good. Breathe out any discomfort or tension. 7. Sleep Enough If you get less than seven to eight hours of sleep, your body won't tolerate stress as well as it  could. If stress keeps you up at night, address the cause, and add extra meditation into your day to make up for the lost z's. Try to get seven to nine hours of sleep each night. Make a regular bedtime schedule. Keep your room dark and cool. Try to avoid computers, TV, cell phones and tablets before bed. 8. Bond with Connections You Enjoy Go out for a coffee with a friend, chat with a neighbor, call a family member, visit with a clergy member, or even hang out with your pet. Clinical studies show that spending even a short time with a companion animal can cut anxiety levels almost in half. 9. Take a Vacation Getting away from it all can reset your stress tolerance by increasing your mental and emotional outlook, which makes you a happier, more productive person upon return. Leave your cellphone and laptop at home! 10. See a Counselor, Coach or Therapist If negative thoughts overwhelm your ability to make positive changes, it's time to seek professional help. Make an appointment today--your health and life are worth it."  Dickie La, BSW, MSW, LCSW Licensed Clinical Social Worker [employer]  [facility] [email or web address] Direct Dial: [phone]

## 2023-08-31 NOTE — Patient Outreach (Signed)
Medicaid Managed Care Social Work Note  08/31/2023 Name:  Rickey Fisher MRN:  [id number] DOB:  16-Dec-1975  Rickey Fisher is an 48 y.o. year old male who is a primary patient of [name], MD.  The Medicaid Managed Care Coordination team was consulted for assistance with:  Community Resources  Mental Health Counseling and Resources  [name] was given information about Medicaid Managed Care Coordination team services today. [name] Patient agreed to services and verbal consent obtained.  Engaged with patient  for by telephone forinitial visit in response to referral for case management and/or care coordination services.   Patient is participating in a Managed Medicaid Plan:  Yes  Assessments/Interventions:  Review of past medical history, allergies, medications, health status, including review of consultants reports, laboratory and other test data, was performed as part of comprehensive evaluation and provision of chronic care management services.  SDOH: (Social Drivers of Health) assessments and interventions performed: SDOH Interventions    Flowsheet Row Patient Outreach Telephone from 08/31/2023 in [place] [facility] POPULATION [facility]ALTH DEPARTMENT Office Visit from 08/25/2023 in [facility] Health Comm Health [place] - A Dept Of [place]. [facility] Office Visit from 11/02/2018 in [facility] [place] - A Dept Of Eligha Bridegroom. [facility]  SDOH Interventions     Food Insecurity Interventions -- Intervention Not Indicated --  Housing Interventions -- Community Resources Provided, Other (Comment)  Erskine Squibb, RN care coordinator was able to help address concerns.] --  Transportation Interventions Payor Benefit Intervention Not Indicated --  Utilities Interventions -- Community Resources Provided  [place], RN care coordinator was able to help address concerns.] --  Alcohol Usage Interventions -- Intervention Not Indicated (Score <7) --  Depression Interventions/Treatment  --   [name] for therapy made to [name] today by [facility] LCSW] -- --  [Social work consult at last visit]  Corporate treasurer Interventions [employer] Provided [employer] Provided  [place], Airline pilot was able to help address concerns.] --  Physical Activity Interventions -- [employer] --  Stress Interventions -- Community Resources Provided  [name], RN care coordinator was able to help address concerns.] --  Social Connections Interventions -- Intervention Not Indicated --  Health Literacy Interventions -- Intervention Not Indicated --       Advanced Directives Status:  See Care Plan for related entries.  Care Plan                 Allergies  Allergen Reactions   Banana Anaphylaxis   Onion Anaphylaxis    Raw   Other Anaphylaxis    RASPBERRIES   Peach [Prunus Persica] Anaphylaxis   Shellfish Allergy Anaphylaxis   Tomato Anaphylaxis   Bee Pollen Itching   Pollen Extract Itching    Medications Reviewed Today   Medications were not reviewed in this encounter     Patient Active Problem List   Diagnosis Date Noted   Food allergy 02/22/2023   Tobacco dependence 02/22/2023   Major depressive disorder, single episode, mild (HCC) 02/22/2023   Weakness 07/13/2017   Status post lumbar spinal fusion 07/13/2017   Left lumbar radiculitis 09/07/2016   Other idiopathic scoliosis, thoracolumbar region 07/13/2016   Poor dentition 05/20/2016   Onychomycosis of toenail 05/20/2016   Tinea pedis 05/20/2016   Anaphylactic shock due to adverse food reaction 05/20/2016   Lumbar spine scoliosis 05/07/2016   Ileostomy present (HCC) 04/07/2012   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    Conditions to be addressed/monitored per PCP order:  Depression  Care Plan : LCSW Plan of Care  Updates made by Gustavus Bryant, LCSW since 08/31/2023 12:00 AM     Problem: Coping Skills (General Plan of Care)      Goal: Coping Skills Enhanced   Note:    Timeframe:  Short Term Range  Goal Priority:  High Start Date:    08/31/23 Expected End Date:  ongoing                     Follow Up Date--09/16/23 at 11 am  Current barriers:   SDOH Barriers Need for housing resources Need for dental resources Need for Financial Assistance and connection to available community resources Desire to start [facility] treatment (MDD dx)  Clinical Goals: Patient will work with agencies/resources/PCP/[facility] LCSW to address needs related to gaining behavioral health and housing resources to help promote the safety and health of patient.  Clinical Interventions:  Inter-disciplinary care team collaboration (see longitudinal plan of care) Advised patient to answer all calls from care providers and to keep phone nearby. Advised parents to contact 911 or [phone] if a crisis occurs.  Assessed patient's previous and current treatment, coping skills, support system and barriers to care. Patient provided hx. Initial note includes: At the request of Dr Laural Benes, I met with the patient when he was in the clinic today for his appointment. He currently receives about $940/month SSI and he also receives food stamps.  He said that his rent is $850/month and he also pays electricity.  He explained that he has been in the apartment about a year and is very pleased that he was able to secure a place of his own. He feels safe and is very happy with the location he is living in but the rent is $850/month and he is not able to afford it any longer. He said that he received an eviction notice and needs to be in court on 09/12/2023. He really does not want to move but understands he does not have much of an option with the rent being so high.  He was in agreement to placing a referral to [facility] for assistance with finding other housing options that are more affordable.   That referral was placed as discussed. I also explained to him that I can refer him to [employer] [place] for possible assistance with mediating the eviction.  I told him that  there is no guarantee that they can help we can try.  He was in agreement and that referral was placed as discussed. His current property management company is : [employer]): [phone]. He then went on to say that the apartment is moldy and there is no seal around the doors and air continuously leaks into the home through large gaps in the door frame. He said he has notified the landlord of these concerns and nothing has been done about it.  I told him that I will notify Legal Aid of those concerns as well. - Called legal aide. Regarding transportation concerns. He said he is aware of the transportation service offered by his insurance company but he doesn't always have time to give a 2-3 day notice for the ride and he prefers to use the city bus Verbalization of feelings encouraged, motivational interviewing employed Emotional support provided, positive coping strategies explored. Establishing healthy boundaries emphasized and healthy self-care education provided Patient was educated on available mental health resources within their area that accept Medicaid and offer counseling and  psychiatry. Patient was advised to contact the back of his insurance card for assistance with benefits as well. He reports being in interested in therapy only (no psychiatry needs). Patient is agreeable to referral to [facility] for counseling. [facility] LCSW made referral on 08/31/23 Patient educated on the difference between therapy and psychiatry. Email sent to patient today with available mental health resources within his area that have housing and utility assistance resources. Email included instructions for scheduling at [facility] as well as some crisis support resources and [facility]'s walk in clinic hours. Patient will review resources over the next two weeks and will stay in contact with [employer] which he was referred to by PCP office regarding his housing needs.  Emotional support provided. CBT intervention  implemented regarding "being mentally fit" by combating negative thinking and replacing it with uplifting support, hope and positivity. Assessed social determinant of health barriers Assessed social determinant of health barriers Patient will work on implementing appropriate self-care habits into their daily routine such as: staying positive, writing a gratitude list, drinking water, staying active around the house, taking their medications as prescribed, combating negative thoughts or emotions and staying connected with their family and friends. Positive reinforcement provided for this decision to work on this.  Motivational Interviewing employed Depression screen reviewed  PHQ2/ PHQ9 completed or reviewed  Mindfulness or Relaxation training provided Active listening / Reflection utilized  Advance Care and HCPOA education provided Emotional Support Provided Problem Solving /Task Center strategies reviewed Provided psychoeducation for mental health needs  Provided brief CBT  Reviewed mental health medications and discussed importance of compliance:  Quality of sleep assessed & Sleep Hygiene techniques promoted  Participation in counseling encouraged  Verbalization of feelings encouraged  Suicidal Ideation/Homicidal Ideation assessed: Patient denies SI/HI  Review resources, discussed options and provided patient information about  Mental Health Resources Inter-disciplinary care team collaboration (see longitudinal plan of care) Patient's main support network includes brother Antonio   Patient Goals/Self-Care Activities: Call provider office for new concerns or questions Consider bumping up self-care  Call [phone] in case of an emergency         08/31/2023   12:06 PM 08/25/2023   11:43 AM 02/22/2023   11:22 AM 01/14/2022    2:19 PM 09/25/2020    2:28 PM  Depression screen PHQ 2/9  Decreased Interest 1 1 2 1 1   Down, Depressed, Hopeless 2 3 2  0 0  PHQ - 2 Score 3 4 4 1 1   Altered  sleeping 2 2 2  0   Tired, decreased energy 2 2 2  0   Change in appetite 0 0 1 1   Feeling bad or failure about yourself  1 2 2  0   Trouble concentrating 0 0 2 0   Moving slowly or fidgety/restless 2 2 0 1   Suicidal thoughts 0 1 0 0   PHQ-9 Score 10 13 13 3    Difficult doing work/chores  Somewhat difficult  Not difficult at all    Follow up:  Patient agrees to Care Plan and Follow-up.  Plan: The Managed Medicaid care management team will reach out to the patient again over the next 30 days.  Dickie La, BSW, MSW, LCSW Licensed Clinical Social Worker [employer]   [facility] [email or web address] Direct Dial: [phone]

## 2023-09-05 ENCOUNTER — Other Ambulatory Visit: Payer: Self-pay

## 2023-09-16 ENCOUNTER — Other Ambulatory Visit: Payer: Self-pay | Admitting: Licensed Clinical Social Worker

## 2023-09-16 DIAGNOSIS — F320 Major depressive disorder, single episode, mild: Secondary | ICD-10-CM

## 2023-09-16 NOTE — Patient Outreach (Signed)
 Medicaid Managed Care Social Work Note  09/16/2023 Name:  Rickey Fisher MRN:  [id number] DOB:  29-Jun-1976  Rickey Fisher is an 48 y.o. year old male who is a primary patient of Rickey Barnie NOVAK, MD.  The Medicaid Managed Care Coordination team was consulted for assistance with:  Mental Health Counseling and Resources  Rickey Fisher was given information about Medicaid Managed Care Coordination team services today. Rickey Fisher Patient agreed to services and verbal consent obtained.  Engaged with patient  for by telephone forfollow up visit in response to referral for case management and/or care coordination services.   Patient is participating in a Managed Medicaid Plan:  Yes  Assessments/Interventions:  Review of past medical history, allergies, medications, health status, including review of consultants reports, laboratory and other test data, was performed as part of comprehensive evaluation and provision of chronic care management services.  SDOH: (Social Drivers of Health) assessments and interventions performed: SDOH Interventions    Flowsheet Row Patient Outreach Telephone from 09/16/2023 in [place] HEALTH POPULATION HEALTH DEPARTMENT Patient Outreach Telephone from 08/31/2023 in [place] HEALTH POPULATION HEALTH DEPARTMENT Office Visit from 08/25/2023 in [facility] Health Comm Health [place] - A Dept Of [place]. [facility] Office Visit from 11/02/2018 in [facility] [place] - A Dept Of [place]. [facility]  SDOH Interventions      Food Insecurity Interventions -- -- Intervention Not Indicated --  Housing Interventions -- -- [employer] Provided, Other (Comment)  [place], RN care coordinator was able to help address concerns.] --  Transportation Interventions -- Payor Benefit Intervention Not Indicated --  Utilities Interventions -- -- Community Resources Provided  [place], Airline Pilot was able to help address concerns.] --  Alcohol Usage Interventions -- --  Intervention Not Indicated (Score <7) --  Depression Interventions/Treatment  -- --  Rickey Fisher for therapy made to [facility] today by Rickey Fisher] -- --  [Social work consult at last visit]  Financial Strain Interventions -- Programmer, Applications Provided [employer] Provided  Rickey Fisher, Air[employer] was able to help address concerns.] --  Physical Activity Interventions -- -- [employer] --  Stress Interventions Provide Counseling, Community Resources Provided -- [employer] Provided  [place], Airline Pilot was able to help address concerns.] --  Social Connections Interventions -- -- Intervention Not Indicated --  Health Literacy Interventions -- -- Intervention Not Indicated --       Advanced Directives Status:  See Care Plan for related entries.  Care Plan                 Allergies  Allergen Reactions   Banana Anaphylaxis   Onion Anaphylaxis    Raw   Other Anaphylaxis    RASPBERRIES   Peach [Prunus Persica] Anaphylaxis   Shellfish Allergy Anaphylaxis   Tomato Anaphylaxis   Bee Pollen Itching   Pollen Extract Itching    Medications Reviewed Today     Reviewed by Rickey Lyle CROME, Fisher (Social Worker) on 09/16/23 at 1123  Med List Status: <None>   Medication Order Taking? Sig Documenting Provider Last Dose Status Informant  EPINEPHrine  0.3 mg/0.3 mL IJ SOAJ injection [id number] No Inject 0.3 mg into the muscle as needed for anaphylaxis. Rickey Barnie NOVAK, MD Taking Active   silver  sulfADIAZINE  (SILVADENE ) 1 % cream [id number]  Apply 1 Application topically daily. Rickey Barnie NOVAK, MD  Active             Patient Active Problem List   Diagnosis  Date Noted   Food allergy 02/22/2023   Tobacco dependence 02/22/2023   Major depressive disorder, single episode, mild (HCC) 02/22/2023   Weakness 07/13/2017   Status post lumbar spinal fusion 07/13/2017   Left lumbar radiculitis 09/07/2016   Other idiopathic scoliosis, thoracolumbar region 07/13/2016   Poor  dentition 05/20/2016   Onychomycosis of toenail 05/20/2016   Tinea pedis 05/20/2016   Anaphylactic shock due to adverse food reaction 05/20/2016   Lumbar spine scoliosis 05/07/2016   Ileostomy present (HCC) 04/07/2012   Congenital anomaly of intestine 11-10-1975    Conditions to be addressed/monitored per PCP order:  Depression  Care Plan : Fisher Plan of Care  Updates made by Rickey Lyle CROME, Fisher since 09/16/2023 12:00 AM     Problem: Coping Skills (General Plan of Care)      Goal: Coping Skills Enhanced   Note:    Timeframe:  Short Term Range Goal Priority:  High Start Date:    08/31/23 Expected End Date:  ongoing                     Follow Up Date--10/07/23 at 1 pm  Current barriers:   SDOH Barriers Need for housing resources Need for dental resources Need for Financial Assistance and connection to available community resources Desire to start [facility] treatment (MDD dx)  Clinical Goals: Patient will work with agencies/resources/PCP/[facility] Fisher to address needs related to gaining behavioral health and housing resources to help promote the safety and health of patient.  Clinical Interventions:  Inter-disciplinary care team collaboration (see longitudinal plan of care) Advised patient to answer all calls from care providers and to keep phone nearby. Advised parents to contact 911 or [phone] if a crisis occurs.  Assessed patient's previous and current treatment, coping skills, support system and barriers to care. Patient provided hx. Initial note includes: At the request of Dr Rickey, I met with the patient when he was in the clinic today for his appointment. He currently receives about $940/month SSI and he also receives food stamps.  He said that his rent is $850/month and he also pays electricity.  He explained that he has been in the apartment about a year and is very pleased that he was able to secure a place of his own. He feels safe and is very happy with the location he is living in  but the rent is $850/month and he is not able to afford it any longer. He said that he received an eviction notice and needs to be in court on 09/12/2023. He really does not want to move but understands he does not have much of an option with the rent being so high.  He was in agreement to placing a referral to [employer] for assistance with finding other housing options that are more affordable.   That referral was placed as discussed. I also explained to him that I can refer him to [employer] [place] for possible assistance with mediating the eviction.  I told him that there is no guarantee that they can help we can try.  He was in agreement and that referral was placed as discussed. His current property management company is : [employer]): [phone]. He then went on to say that the apartment is moldy and there is no seal around the doors and air continuously leaks into the home through large gaps in the door frame. He said he has notified the landlord of these concerns and nothing has  been done about it.  I told him that I will notify [employer] Aid of those concerns as well. - Called legal aide. Regarding transportation concerns. He said he is aware of the transportation service offered by his insurance company but he doesn't always have time to give a 2-3 day notice for the ride and he prefers to use the city bus Verbalization of feelings encouraged, motivational interviewing employed Emotional support provided, positive coping strategies explored. Establishing healthy boundaries emphasized and healthy self-care education provided Patient was educated on available mental health resources within their area that accept Medicaid and offer counseling and psychiatry. Patient was advised to contact the back of his insurance card for assistance with benefits as well. He reports being in interested in therapy only (no psychiatry needs). Patient is agreeable to referral to [facility] for counseling. [facility] Fisher  made referral on 08/31/23 Patient educated on the difference between therapy and psychiatry. Email sent to patient today with available mental health resources within his area that have housing and utility assistance resources. Email included instructions for scheduling at [facility] as well as some crisis support resources and [facility]'s walk in clinic hours. Patient will review resources over the next two weeks and will stay in contact with [employer] which he was referred to by PCP office regarding his housing needs.  Emotional support provided. CBT intervention implemented regarding being mentally fit by combating negative thinking and replacing it with uplifting support, hope and positivity. Assessed social determinant of health barriers Patient will work on implementing appropriate self-care habits into their daily routine such as: staying positive, writing a gratitude list, drinking water, staying active around the house, taking their medications as prescribed, combating negative thoughts or emotions and staying connected with their family and friends. Positive reinforcement provided for this decision to work on this.  Motivational Interviewing employed Depression screen reviewed  PHQ2/ PHQ9 completed or reviewed  Mindfulness or Relaxation training provided Active listening / Reflection utilized  Advance Care and HCPOA education provided Emotional Support Provided Problem Solving /Task Center strategies reviewed Provided psychoeducation for mental health needs  Provided brief CBT  Reviewed mental health medications and discussed importance of compliance:  Quality of sleep assessed & Sleep Hygiene techniques promoted  Participation in counseling encouraged  Verbalization of feelings encouraged  Suicidal Ideation/Homicidal Ideation assessed: Patient denies SI/HI  Review resources, discussed options and provided patient information about  Mental Health Resources Inter-disciplinary care team  collaboration (see longitudinal plan of care) Patient's main support network includes brother Antonio  Referral placed to [facility] for therapy on 09/16/23 with patient's permission.  Referral placed to [facility] for further housing resource support and connection.  Healthy coping skill education for Depression Management provided.   Patient Goals/Self-Care Activities: Call provider office for new concerns or questions Consider bumping up self-care  Call [phone] in case of an emergency       Follow up:  Patient agrees to Care Plan and Follow-up.  Plan: The Managed Medicaid care management team will reach out to the patient again over the next 30 days.  Lyle Rung, BSW, MSW, Fisher Licensed Clinical Social Worker [employer]   [email or web address] Direct Dial: [phone]

## 2023-09-16 NOTE — Patient Instructions (Signed)
 Visit Information  [name] was given information about Medicaid Managed Care team care coordination services as a part of their [facility] Community Plan Medicaid benefit. Rickey Fisher verbally consented to engagement with the [facility] Managed Care team.   If you are experiencing a medical emergency, please call 911 or report to your local emergency department or urgent care.   If you have a non-emergency medical problem during routine business hours, please contact your provider's office and ask to speak with a nurse.   For questions related to your [facility], please call: [phone] or visit the homepage here: [email or web address]  If you would like to schedule transportation through your [facility], please call the following number at least 2 days in advance of your appointment: [phone]   Rides for urgent appointments can also be made after hours by calling Member Services.  Call the Behavioral Health Crisis Line at [phone], at any time, 24 hours a day, 7 days a week. If you are in danger or need immediate medical attention call 911.  If you would like help to quit smoking, call 1-800-QUIT-NOW (347-477-3588) OR Espaol: 1-855-Djelo-Ya (8-144-664-6430) o para ms informacin haga clic aqu or Text READY to 799-599 to register via text  Following is a copy of your plan of care:  Care Plan : LCSW Plan of Care  Updates made by Rickey Lyle CROME, LCSW since 09/16/2023 12:00 AM     Problem: Coping Skills (General Plan of Care)      Goal: Coping Skills Enhanced   Note:    Timeframe:  Short Term Range Goal Priority:  High Start Date:    08/31/23 Expected End Date:  ongoing                     Follow Up Date--10/07/23 at 1 pm  Current barriers:   SDOH Barriers Need for housing resources Need for dental resources Need for Financial Assistance and connection to  available community resources Desire to start [facility] treatment (MDD dx)  Clinical Goals: Patient will work with agencies/resources/PCP/[facility] LCSW to address needs related to gaining behavioral health and housing resources to help promote the safety and health of patient.  Patient Goals/Self-Care Activities: Call provider office for new concerns or questions Consider bumping up self-care  Call [phone] in case of an emergency     24- Hour Availability:    [facility]  [address] [place], [place] [phone] Crisis [phone]   [facility] [phone]  [place] [facility]  [phone]    [facility]  [phone] (after hours)   Therapeutic Alternative/Mobile Crisis   [phone]   [place]  National Suicide Hotline  913-236-0907 [place]) OR 988   Call [phone] for mental health emergencies   [facility]  [phone]);  [place] and [employer]  [phone]); [place], [place], [place], [place], [place], [place], [place]    [place] [facility] for [facility] Residents For 24/7 walk-up access to mental health services for [facility] children (4+), adolescents and adults, please visit the [facility] located at [address] in [place], [place].  *[place] also provides comprehensive outpatient behavioral health services in a variety of locations around the [place].  Connect With Us  [address] [place], [place] [address] HelpLine: [phone] or [phone]  Get Directions  Find Help 24/7 By Phone Call our  24-hour HelpLine at (380)029-2087 or (719)072-8360 for immediate assistance for mental health and substance abuse issues.  Walk-In Help [place]: [facility] (Ages 4 and Up) [place]: Emergency Dept., [facility] Additional  Resources National Hopeline Network: 1-800-SUICIDE The National Suicide Prevention Lifeline: 1-800-273-TALK     The following coping skill education was provided for stress relief and mental health management: When your car dies or a deadline looms, how do you respond? Long-term, low-grade or acute stress takes a serious toll on your body and mind, so don't ignore feelings of constant tension. Stress is a natural part of life. However, too much stress can harm our health, especially if it continues every day. This is chronic stress and can put you at risk for heart problems like heart disease and depression. Understand what's happening inside your body and learn simple coping skills to combat the negative impacts of everyday stressors.  Types of Stress There are two types of stress: Emotional - types of emotional stress are relationship problems, pressure at work, financial worries, experiencing discrimination or having a major life change. Physical - Examples of physical stress include being sick having pain, not sleeping well, recovery from an injury or having an alcohol and drug use disorder. Fight or Flight Sudden or ongoing stress activates your nervous system and floods your bloodstream with adrenaline and cortisol, two hormones that raise blood pressure, increase heart rate and spike blood sugar. These changes pitch your body into a fight or flight response. That enabled our ancestors to outrun saber-toothed tigers, and it's helpful today for situations like dodging a car accident. But most modern chronic stressors, such as finances or a challenging relationship, keep your body in that heightened state, which hurts your health. Effects of Too Much Stress If constantly under stress, most of us  will eventually start to function less well.  Multiple studies link chronic stress to a higher risk of heart disease, stroke, depression, weight gain, memory loss and even premature death, so it's  important to recognize the warning signals. Talk to your doctor about ways to manage stress if you're experiencing any of these symptoms: Prolonged periods of poor sleep. Regular, severe headaches. Unexplained weight loss or gain. Feelings of isolation, withdrawal or worthlessness. Constant anger and irritability. Loss of interest in activities. Constant worrying or obsessive thinking. Excessive alcohol or drug use. Inability to concentrate.  10 Ways to Cope with Chronic Stress It's key to recognize stressful situations as they occur because it allows you to focus on managing how you react. We all need to know when to close our eyes and take a deep breath when we feel tension rising. Use these tips to prevent or reduce chronic stress. 1. Rebalance Work and Home All work and no play? If you're spending too much time at the office, intentionally put more dates in your calendar to enjoy time for fun, either alone or with others. 2. Get Regular Exercise Moving your body on a regular basis balances the nervous system and increases blood circulation, helping to flush out stress hormones. Even a daily 20-minute walk makes a difference. Any kind of exercise can lower stress and improve your mood ? just pick activities that you enjoy and make it a regular habit. 3. Eat Well and Limit Alcohol and Stimulants Alcohol, nicotine and caffeine may temporarily relieve stress but have negative health impacts and can make stress worse in the long run. Well-nourished bodies cope better, so start with a good  breakfast, add more organic fruits and vegetables for a well-balanced diet, avoid processed foods and sugar, try herbal tea and drink more water. 4. Connect with Supportive People Talking face to face with another person releases hormones that reduce stress. Lean on those good listeners in your life. 5. Carve Out Hobby Time Do you enjoy gardening, reading, listening to music or some other creative pursuit?  Engage in activities that bring you pleasure and joy; research shows that reduces stress by almost half and lowers your heart rate, too. 6. Practice Meditation, Stress Reduction or Yoga Relaxation techniques activate a state of restfulness that counterbalances your body's fight-or-flight hormones. Even if this also means a 10-minute break in a long day: listen to music, read, go for a walk in nature, do a hobby, take a bath or spend time with a friend. Also consider doing a mindfulness exercise or try a daily deep breathing or imagery practice. Deep Breathing Slow, calm and deep breathing can help you relax. Try these steps to focus on your breathing and repeat as needed. Find a comfortable position and close your eyes. Exhale and drop your shoulders. Breathe in through your nose; fill your lungs and then your belly. Think of relaxing your body, quieting your mind and becoming calm and peaceful. Breathe out slowly through your nose, relaxing your belly. Think of releasing tension, pain, worries or distress. Repeat steps three and four until you feel relaxed. Imagery This involves using your mind to excite the senses -- sound, vision, smell, taste and feeling. This may help ease your stress. Begin by getting comfortable and then do some slow breathing. Imagine a place you love being at. It could be somewhere from your childhood, somewhere you vacationed or just a place in your imagination. Feel how it is to be in the place you're imagining. Pay attention to the sounds, air, colors, and who is there with you. This is a place where you feel cared for and loved. All is well. You are safe. Take in all the smells, sounds, tastes and feelings. As you do, feel your body being nourished and healed. Feel the calm that surrounds you. Breathe in all the good. Breathe out any discomfort or tension. 7. Sleep Enough If you get less than seven to eight hours of sleep, your body won't tolerate stress as well as it  could. If stress keeps you up at night, address the cause, and add extra meditation into your day to make up for the lost z's. Try to get seven to nine hours of sleep each night. Make a regular bedtime schedule. Keep your room dark and cool. Try to avoid computers, TV, cell phones and tablets before bed. 8. Bond with Connections You Enjoy Go out for a coffee with a friend, chat with a neighbor, call a family member, visit with a clergy member, or even hang out with your pet. Clinical studies show that spending even a short time with a companion animal can cut anxiety levels almost in half. 9. Take a Vacation Getting away from it all can reset your stress tolerance by increasing your mental and emotional outlook, which makes you a happier, more productive person upon return. Leave your cellphone and laptop at home! 10. See a Counselor, Coach or Therapist If negative thoughts overwhelm your ability to make positive changes, it's time to seek professional help. Make an appointment today--your health and life are worth it.  Lyle Rung, BSW, MSW, LCSW Licensed Clinical Social Worker [employer]  [facility] [email or web address] Direct Dial: [phone]

## 2023-09-30 ENCOUNTER — Other Ambulatory Visit: Payer: Self-pay

## 2023-09-30 NOTE — Patient Outreach (Signed)
Medicaid Managed Care Rickey Work Note  09/30/2023 Name:  Rickey Fisher MRN:  [id number] DOB:  1975-09-02  Rickey Fisher is an 48 y.o. year old male who is a primary Fisher of [name], MD.  The Medicaid Managed Care Coordination team was consulted for assistance with:   housing   Rickey Fisher was given information about Medicaid Managed Care Coordination team services today. Rickey Fisher agreed to services and verbal consent obtained.  Engaged with Fisher  for by telephone forinitial Fisher in response to referral for case management and/or care coordination services.   Fisher is participating in a Managed Medicaid Plan:  Yes  Assessments/Interventions:  Review of past medical history, allergies, medications, health status, including review of consultants reports, laboratory and other test data, was performed as part of comprehensive evaluation and provision of chronic care management services.  SDOH: (Rickey Drivers of Health) assessments and interventions performed: SDOH Interventions    Flowsheet Row Fisher Outreach Telephone from 09/16/2023 in [place] HEALTH POPULATION HEALTH DEPARTMENT Fisher Outreach Telephone from 08/31/2023 in [place] HEALTH POPULATION HEALTH DEPARTMENT Office Fisher from 08/25/2023 in [facility] Health Comm Health [place] - A Dept Of Veteran. [facility] Office Fisher from 11/02/2018 in [facility] - A Dept Of Eligha Bridegroom. [facility]  SDOH Interventions      Food Insecurity Interventions -- -- Intervention Not Indicated --  Housing Interventions -- -- [employer] Provided, Other (Comment)  Rickey Squibb, RN care coordinator was able to help address concerns.] --  Transportation Interventions -- Payor Benefit Intervention Not Indicated --  Utilities Interventions -- -- Community Resources Provided  [place], Airline pilot was able to help address concerns.] --  Alcohol Usage Interventions -- -- Intervention Not Indicated  (Score <7) --  Depression Interventions/Treatment  -- --  Rickey Fisher for therapy made to [facility] today by Rickey Fisher] -- --  [Rickey Fisher]  Financial Strain Interventions -- Programmer, applications Provided [employer] Provided  [place], Airline pilot was able to help address concerns.] --  Physical Activity Interventions -- -- [employer] --  Stress Interventions Provide Counseling, Community Resources Provided -- [employer] Provided  [place], Airline pilot was able to help address concerns.] --  Rickey Connections Interventions -- -- Intervention Not Indicated --  Health Literacy Interventions -- -- Intervention Not Indicated --     Rickey Fisher completed a telephone outreach with Fisher, he states he left his apartment (not an eviction) and legal aid was helping him. Fisher states he receives Ssi and his bouncing around to his sister home and hotels. Fisher  states he did receive some housing resources. Rickey Fisher and Fisher agreed for additional resources to be emailed to email on file. Fisher states he does receive foodstamps.   Advanced Directives Status:  Not addressed in this encounter.  Care Plan                 Allergies  Allergen Reactions   Banana Anaphylaxis   Onion Anaphylaxis    Raw   Other Anaphylaxis    RASPBERRIES   Peach [Prunus Persica] Anaphylaxis   Shellfish Allergy Anaphylaxis   Tomato Anaphylaxis   Bee Pollen Itching   Pollen Extract Itching    Medications Reviewed Today   Medications were not reviewed in this encounter     Fisher Active Problem List   Diagnosis Date Noted   Food allergy 02/22/2023   Tobacco dependence 02/22/2023   Major depressive disorder,  single episode, mild (HCC) 02/22/2023   Weakness 07/13/2017   Status post lumbar spinal fusion 07/13/2017   Left lumbar radiculitis 09/07/2016   Other idiopathic scoliosis, thoracolumbar region 07/13/2016   Poor dentition 05/20/2016   Onychomycosis of toenail  05/20/2016   Tinea pedis 05/20/2016   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   Follow up:  Fisher agrees to Care Plan and Follow-up.  Plan: The Managed Medicaid care management team will reach out to the Fisher again over the next 30 days.  Date/time of next scheduled Rickey Work care management/care coordination outreach:  10/28/23  Rickey Fisher, Rickey Fisher, Rickey Fisher

## 2023-09-30 NOTE — Patient Instructions (Signed)
Visit Information  Rickey Fisher was given information about Medicaid Managed Care team care coordination services as a part of their [facility] Community Plan Medicaid benefit. Rickey Fisher verbally consented to engagement with the [facility] Managed Care team.   If you are experiencing a medical emergency, please call 911 or report to your local emergency department or urgent care.   If you have a non-emergency medical problem during routine business hours, please contact your provider's office and ask to speak with a nurse.   For questions related to your [facility], please call: [phone] or visit the homepage here: [email or web address]  If you would like to schedule transportation through your [facility], please call the following number at least 2 days in advance of your appointment: [phone]   Rides for urgent appointments can also be made after hours by calling Member Services.  Call the Behavioral Health Crisis Line at [phone], at any time, 24 hours a day, 7 days a week. If you are in danger or need immediate medical attention call 911.  If you would like help to quit smoking, call 1-800-QUIT-NOW (704-132-1695) OR Espaol: 1-855-Djelo-Ya (4-742-595-6387) o para ms informacin haga clic aqu or Text READY to 564-332 to register via text  Rickey Fisher - following are the goals we discussed in your visit today:   Goals Addressed   None      Social Worker will follow up in 30 days.   Rickey Fisher, Rickey Fisher, MHA [facility] Health  Managed Medicaid Social Worker [phone]   Following is a copy of your plan of care:  There are no care plans that you recently modified to display for this patient.

## 2023-10-07 ENCOUNTER — Other Ambulatory Visit: Payer: Self-pay | Admitting: Licensed Clinical Social Worker

## 2023-10-07 DIAGNOSIS — F320 Major depressive disorder, single episode, mild: Secondary | ICD-10-CM

## 2023-10-07 NOTE — Patient Instructions (Signed)
 Visit Information  Rickey Fisher was given information about Medicaid Managed Care team care coordination services as a part of their [facility] Community Plan Medicaid benefit. Rickey Fisher verbally consented to engagement with the [facility] Managed Care team.   If you are experiencing a medical emergency, please call 911 or report to your local emergency department or urgent care.   If you have a non-emergency medical problem during routine business hours, please contact your provider's office and ask to speak with a nurse.   For questions related to your [facility], please call: [phone] or visit the homepage here: [email or web address]  If you would like to schedule transportation through your [facility], please call the following number at least 2 days in advance of your appointment: [phone]   Rides for urgent appointments can also be made after hours by calling Member Services.  Call the Behavioral Health Crisis Line at [phone], at any time, 24 hours a day, 7 days a week. If you are in danger or need immediate medical attention call 911.  If you would like help to quit smoking, call 1-800-QUIT-NOW (408-235-0359) OR Espaol: 1-855-Djelo-Ya (7-253-664-4034) o para ms informacin haga clic aqu or Text READY to 742-595 to register via text  Following is a copy of your plan of care:  Care Plan : LCSW Plan of Care  Updates made by Gustavus Bryant, LCSW since 10/07/2023 12:00 AM     Problem: Coping Skills (General Plan of Care)      Goal: Coping Skills Enhanced   Note:    Timeframe:  Short Term Range Goal Priority:  High Start Date:    08/31/23 Expected End Date:  ongoing                     Follow Up Date--to be determined, this [facility] LCSW will be out of short term leave starting 10/14/23 and family was made aware  Current barriers:   SDOH Barriers Need for  housing resources Need for dental resources Need for Financial Assistance and connection to available community resources Desire to start [facility] treatment (MDD dx)  Clinical Goals: Patient will work with agencies/resources/PCP/[facility] LCSW to address needs related to gaining behavioral health and housing resources to help promote the safety and health of patient.  Patient Goals/Self-Care Activities: Call provider office for new concerns or questions Consider bumping up self-care  Call [phone] in case of an emergency     24- Hour Availability:    [facility]  [address] [place], [place] [phone] Crisis [phone]   [facility] Service of the [facility] [phone]  [place] [facility]  [phone]    [facility]  [phone] (after hours)   Therapeutic Alternative/Mobile Crisis   [phone]   [place] National Suicide Hotline  [phone] Len Childs) [place] [address]   Call [phone]) [phone] for mental health emergencies   [facility]  [phone]);  [place] and [employer]  [phone]); [place], [place], [place], [place], [place], [place], [place]    [place] [facility] for [facility] Residents For 24/7 walk-up access to mental health services for [facility] children (4+), adolescents and adults, please visit the [employer] located at [address] in [place], [place].  *[place] also provides comprehensive outpatient behavioral health services in a variety of locations around the [place].  Connect With [place] [address] [place], [place]  [address] HelpLine: ([phone])[phone] or 1-(765)425-1402  Get Directions  Find Help 24/7 By Phone Call our 24-hour HelpLine at 262-424-6495 or 8560939694 for immediate assistance for mental health and substance abuse issues.  Walk-In Help [place]: [facility] (Ages 4  and Up) [place]: Emergency Dept., [facility] Additional Resources National Hopeline Network: 1-800-SUICIDE The National Suicide Prevention Lifeline: 1-800-273-TALK     The following coping skill education was provided for stress relief and mental health management: "When your car dies or a deadline looms, how do you respond? Long-term, low-grade or acute stress takes a serious toll on your body and mind, so don't ignore feelings of constant tension. Stress is a natural part of life. However, too much stress can harm our health, especially if it continues every day. This is chronic stress and can put you at risk for heart problems like heart disease and depression. Understand what's happening inside your body and learn simple coping skills to combat the negative impacts of everyday stressors.  Types of Stress There are two types of stress: Emotional - types of emotional stress are relationship problems, pressure at work, financial worries, experiencing discrimination or having a major life change. Physical - Examples of physical stress include being sick having pain, not sleeping well, recovery from an injury or having an alcohol and drug use disorder. Fight or Flight Sudden or ongoing stress activates your nervous system and floods your bloodstream with adrenaline and cortisol, two hormones that raise blood pressure, increase heart rate and spike blood sugar. These changes pitch your body into a fight or flight response. That enabled our ancestors to outrun saber-toothed tigers, and it's helpful today for situations like dodging a car accident. But most modern chronic stressors, such as finances or a challenging relationship, keep your body in that heightened state, which hurts your health. Effects of Too Much Stress If constantly under stress, most of [place] will eventually start to function less well.  Multiple studies link chronic stress to a higher risk of heart  disease, stroke, depression, weight gain, memory loss and even premature death, so it's important to recognize the warning signals. Talk to your doctor about ways to manage stress if you're experiencing any of these symptoms: Prolonged periods of poor sleep. Regular, severe headaches. Unexplained weight loss or gain. Feelings of isolation, withdrawal or worthlessness. Constant anger and irritability. Loss of interest in activities. Constant worrying or obsessive thinking. Excessive alcohol or drug use. Inability to concentrate.  10 Ways to Cope with Chronic Stress It's key to recognize stressful situations as they occur because it allows you to focus on managing how you react. We all need to know when to close our eyes and take a deep breath when we feel tension rising. Use these tips to prevent or reduce chronic stress. 1. Rebalance Work and Home All work and no play? If you're spending too much time at the office, intentionally put more dates in your calendar to enjoy time for fun, either alone or with others. 2. Get Regular Exercise Moving your body on a regular basis balances the nervous system and increases blood circulation, helping to flush out stress hormones. Even a daily 20-minute walk makes a difference. Any kind of exercise can lower stress and improve your mood ? just pick activities that you enjoy and make it a regular habit. 3. Eat Well and Limit Alcohol and Stimulants Alcohol, nicotine and caffeine may temporarily relieve stress but have negative health impacts and can  make stress worse in the long run. Well-nourished bodies cope better, so start with a good breakfast, add more organic fruits and vegetables for a well-balanced diet, avoid processed foods and sugar, try herbal tea and drink more water. 4. Connect with Supportive People Talking face to face with another person releases hormones that reduce stress. Lean on those good listeners in your life. 5. Carve Out Hobby  Time Do you enjoy gardening, reading, listening to music or some other creative pursuit? Engage in activities that bring you pleasure and joy; research shows that reduces stress by almost half and lowers your heart rate, too. 6. Practice Meditation, Stress Reduction or Yoga Relaxation techniques activate a state of restfulness that counterbalances your body's fight-or-flight hormones. Even if this also means a 10-minute break in a long day: listen to music, read, go for a walk in nature, do a hobby, take a bath or spend time with a friend. Also consider doing a mindfulness exercise or try a daily deep breathing or imagery practice. Deep Breathing Slow, calm and deep breathing can help you relax. Try these steps to focus on your breathing and repeat as needed. Find a comfortable position and close your eyes. Exhale and drop your shoulders. Breathe in through your nose; fill your lungs and then your belly. Think of relaxing your body, quieting your mind and becoming calm and peaceful. Breathe out slowly through your nose, relaxing your belly. Think of releasing tension, pain, worries or distress. Repeat steps three and four until you feel relaxed. Imagery This involves using your mind to excite the senses -- sound, vision, smell, taste and feeling. This may help ease your stress. Begin by getting comfortable and then do some slow breathing. Imagine a place you love being at. It could be somewhere from your childhood, somewhere you vacationed or just a place in your imagination. Feel how it is to be in the place you're imagining. Pay attention to the sounds, air, colors, and who is there with you. This is a place where you feel cared for and loved. All is well. You are safe. Take in all the smells, sounds, tastes and feelings. As you do, feel your body being nourished and healed. Feel the calm that surrounds you. Breathe in all the good. Breathe out any discomfort or tension. 7. Sleep Enough If you  get less than seven to eight hours of sleep, your body won't tolerate stress as well as it could. If stress keeps you up at night, address the cause, and add extra meditation into your day to make up for the lost z's. Try to get seven to nine hours of sleep each night. Make a regular bedtime schedule. Keep your room dark and cool. Try to avoid computers, TV, cell phones and tablets before bed. 8. Bond with Connections You Enjoy Go out for a coffee with a friend, chat with a neighbor, call a family member, visit with a clergy member, or even hang out with your pet. Clinical studies show that spending even a short time with a companion animal can cut anxiety levels almost in half. 9. Take a Vacation Getting away from it all can reset your stress tolerance by increasing your mental and emotional outlook, which makes you a happier, more productive person upon return. Leave your cellphone and laptop at home! 10. See a Counselor, Coach or Therapist If negative thoughts overwhelm your ability to make positive changes, it's time to seek professional help. Make an appointment today--your health and life  are worth it."  Dickie La, BSW, MSW, [employer] Licensed Clinical Social Worker [employer]   [facility] [email or web address] Direct Dial: [phone]

## 2023-10-07 NOTE — Patient Outreach (Addendum)
 Medicaid Managed Care Social Work Note  10/07/2023 Name:  Rickey Fisher MRN:  [id number] DOB:  [birth year]  Rickey Fisher is an 49 y.o. year old male who is a primary patient of Rickey Matar, Rickey Fisher.  The Medicaid Managed Care Coordination team was consulted for assistance with:  Mental Health Counseling and Resources  Rickey Fisher was given information about Medicaid Managed Care Coordination team services today. Rickey Fisher Patient agreed to services and verbal consent obtained.  Engaged with patient  for by telephone forfollow up visit in response to referral for case management and/or care coordination services.   Patient is participating in a Managed Medicaid Plan:  Yes  Assessments/Interventions:  Review of past medical history, allergies, medications, health status, including review of consultants reports, laboratory and other test data, was performed as part of comprehensive evaluation and provision of chronic care management services.  SDOH: (Social Drivers of Health) assessments and interventions performed: SDOH Interventions    Flowsheet Row Patient Outreach Telephone from 09/16/2023 in [place] HEALTH POPULATION HEALTH DEPARTMENT Patient Outreach Telephone from 08/31/2023 in [place] HEALTH POPULATION HEALTH DEPARTMENT Office Visit from 08/25/2023 in [facility] [place] - A Dept Of [place]. Rickey Fisher Office Visit from 11/02/2018 in [facility] - A Dept Of Eligha Bridegroom. [facility]  SDOH Interventions      Food Insecurity Interventions -- -- Intervention Not Indicated --  Housing Interventions -- -- [employer] Provided, Other (Comment)  Rickey Squibb, Rickey Fisher care coordinator was able to help address concerns.] --  Transportation Interventions -- Payor Benefit Intervention Not Indicated --  Utilities Interventions -- -- Community Resources Provided  [place], Airline pilot was able to help address concerns.] --  Alcohol Usage Interventions -- --  Intervention Not Indicated (Score <7) --  Depression Interventions/Treatment  -- --  Rickey Fisher for therapy made to [facility] today by [facility] Rickey Fisher] -- --  [Social work consult at last visit]  Financial Strain Interventions -- Programmer, applications Provided [employer] Provided  [place], Airline pilot was able to help address concerns.] --  Physical Activity Interventions -- -- [employer] --  Stress Interventions Provide Counseling, Community Resources Provided -- [employer] Provided  [place], Airline pilot was able to help address concerns.] --  Social Connections Interventions -- -- Intervention Not Indicated --  Health Literacy Interventions -- -- Intervention Not Indicated --       Advanced Directives Status:  See Care Plan for related entries.  Care Plan                 Allergies  Allergen Reactions   Banana Anaphylaxis   Onion Anaphylaxis    Raw   Other Anaphylaxis    RASPBERRIES   Peach [Prunus Persica] Anaphylaxis   Shellfish Allergy Anaphylaxis   Tomato Anaphylaxis   Bee Pollen Itching   Pollen Extract Itching    Medications Reviewed Today     Reviewed by Rickey Bryant, Rickey Fisher (Social Worker) on 10/07/23 at 1244  Med List Status: <None>   Medication Order Taking? Sig Documenting Provider Last Dose Status Informant  EPINEPHrine 0.3 mg/0.3 mL IJ SOAJ injection [id number] No Inject 0.3 mg into the muscle as needed for anaphylaxis. Rickey Matar, Rickey Fisher Taking Active   silver sulfADIAZINE (SILVADENE) 1 % cream [id number]  Apply 1 Application topically daily. Rickey Matar, Rickey Fisher  Active             Patient Active Problem List   Diagnosis  Date Noted   Food allergy 02/22/2023   Tobacco dependence 02/22/2023   Major depressive disorder, single episode, mild (HCC) 02/22/2023   Weakness 07/13/2017   Status post lumbar spinal fusion 07/13/2017   Left lumbar radiculitis 09/07/2016   Other idiopathic scoliosis, thoracolumbar region 07/13/2016   Poor  dentition 05/20/2016   Onychomycosis of toenail 05/20/2016   Tinea pedis 05/20/2016   Anaphylactic shock due to adverse food reaction 05/20/2016   Lumbar spine scoliosis 05/07/2016   Ileostomy present (HCC) 04/07/2012   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    Conditions to be addressed/monitored per PCP order:  Depression  Care Plan : Rickey Fisher Plan of Care  Updates made by Rickey Bryant, Rickey Fisher since 10/07/2023 12:00 AM     Problem: Coping Skills (General Plan of Care)      Goal: Coping Skills Enhanced   Note:    Timeframe:  Short Term Range Goal Priority:  High Start Date:    08/31/23 Expected End Date:  ongoing                     Follow Up Date--to be determined, this [facility] Rickey Fisher will be out of short term leave starting 10/14/23 and family was made aware  Current barriers:   SDOH Barriers Need for housing resources Need for dental resources Need for Financial Assistance and connection to available community resources Desire to start [facility] treatment (MDD dx)  Clinical Goals: Patient will work with agencies/resources/PCP/[facility] Rickey Fisher to address needs related to gaining behavioral health and housing resources to help promote the safety and health of patient.  Clinical Interventions:  Inter-disciplinary care team collaboration (see longitudinal plan of care) Advised patient to answer all calls from care providers and to keep phone nearby. Advised parents to contact 911 or [phone] if a crisis occurs.  Assessed patient's previous and current treatment, coping skills, support system and barriers to care. Patient provided hx. Initial note includes: At the request of Rickey Fisher, I met with the patient when he was in the clinic today for his appointment. He currently receives about $940/month SSI and he also receives food stamps.  He said that his rent is $850/month and he also pays electricity.  He explained that he has been in the apartment about a year and is very pleased that he was able to  secure a place of his own. He feels safe and is very happy with the location he is living in but the rent is $850/month and he is not able to afford it any longer. He said that he received an eviction notice and needs to be in court on 09/12/2023. He really does not want to move but understands he does not have much of an option with the rent being so high.  He was in agreement to placing a referral to [facility] for assistance with finding other housing options that are more affordable.   That referral was placed as discussed. I also explained to him that I can refer him to [employer] [place] for possible assistance with mediating the eviction.  I told him that there is no guarantee that they can help we can try.  He was in agreement and that referral was placed as discussed. His current property management company is : [employer]): [phone]. He then went on to say that the apartment is moldy and there is no seal around the doors and air continuously leaks into the home through large gaps in  the door frame. He said he has notified the landlord of these concerns and nothing has been done about it.  I told him that I will notify Legal Aid of those concerns as well. - Called legal aide. Regarding transportation concerns. He said he is aware of the transportation service offered by his insurance company but he doesn't always have time to give a 2-3 day notice for the ride and he prefers to use the city bus Verbalization of feelings encouraged, motivational interviewing employed Emotional support provided, positive coping strategies explored. Establishing healthy boundaries emphasized and healthy self-care education provided Patient was educated on available mental health resources within their area that accept Medicaid and offer counseling and psychiatry. Patient was advised to contact the back of his insurance card for assistance with benefits as well. He reports being in interested in therapy  only (no psychiatry needs). Patient is agreeable to referral to [facility] for counseling. [facility] Rickey Fisher made referral on 08/31/23 Patient educated on the difference between therapy and psychiatry. Email sent to patient today with available mental health resources within his area that have housing and utility assistance resources. Email included instructions for scheduling at [place] [facility] as well as some crisis support resources and [facility]'s walk in clinic hours. Patient will review resources over the next two weeks and will stay in contact with [employer] which he was referred to by PCP office regarding his housing needs.  Emotional support provided. CBT intervention implemented regarding "being mentally fit" by combating negative thinking and replacing it with uplifting support, hope and positivity. Assessed social determinant of health barriers Patient will work on implementing appropriate self-care habits into their daily routine such as: staying positive, writing a gratitude list, drinking water, staying active around the house, taking their medications as prescribed, combating negative thoughts or emotions and staying connected with their family and friends. Positive reinforcement provided for this decision to work on this.  Motivational Interviewing employed Depression screen reviewed  PHQ2/ PHQ9 completed or reviewed  Mindfulness or Relaxation training provided Active listening / Reflection utilized  Advance Care and HCPOA education provided Emotional Support Provided Problem Solving /Task Center strategies reviewed Provided psychoeducation for mental health needs  Provided brief CBT  Reviewed mental health medications and discussed importance of compliance:  Quality of sleep assessed & Sleep Hygiene techniques promoted  Participation in counseling encouraged  Verbalization of feelings encouraged  Suicidal Ideation/Homicidal Ideation assessed: Patient denies SI/HI  Review resources, discussed options and  provided patient information about  Mental Health Resources Inter-disciplinary care team collaboration (see longitudinal plan of care) Patient's main support network includes brother Rickey Fisher  Referral placed to [facility] for therapy on 09/16/23 with patient's permission.  Referral placed to [facility] Rickey Fisher for further housing resource support and connection.  Healthy coping skill education for Depression Management provided.  [facility] Rickey Fisher successfully completed follow up call with patient. Patient denies any current crises at this time. He denies any SI/HI. He is aware of crisis support resources that he can utilize if ever needed. He reports that he is still actively wanting talk therapy. Patient was advised that the therapy referral that [facility] Rickey Fisher placed within the [facility] system was transferred to the [place] office due to his commercial insurance. However, pt has not received a call and referral does not seem to be successfully transferred so [facility] Rickey Fisher will place a brand new referral on 10/07/23 with patient's permission. Patient was made aware that [facility] Rickey Fisher will be going on short term leave next week  and that his next Rickey Fisher follow up appointment with [facility] team will be with a new provider.   Patient Goals/Self-Care Activities: Call provider office for new concerns or questions Consider bumping up self-care  Call [phone] in case of an emergency       Follow up:  Patient agrees to Care Plan and Follow-up.  Plan: The Managed Medicaid care management team will reach out to the patient again over the next 30 days.  Rickey Fisher, Rickey Fisher, Rickey Fisher, Rickey Fisher Licensed Clinical Social Worker [employer]   [email or web address] Direct Dial: [phone]

## 2023-10-11 ENCOUNTER — Telehealth: Payer: Self-pay | Admitting: *Deleted

## 2023-10-11 NOTE — Telephone Encounter (Signed)
 Rickey Fisher   Scheduled with you on 10/26/23  [facility]  Value-Based Care Institute, [facility] Guide  Direct Dial: [phone]  Fax [phone]

## 2023-10-11 NOTE — Progress Notes (Signed)
 High Risk Managed Medicaid  Care Guide Note  10/11/2023 Name: Jaree Trinka MRN: [id number] DOB: 03/25/1976  Strother Everitt is a 48 y.o. year old male who is a primary care patient of [name], MD and is actively engaged with the care management team. I reached out to [name] by phone today to assist with re-scheduling  with the Licensed Clinical Child psychotherapist.  Follow up plan: Telephone appointment with complex care management team member scheduled for:  3/19  Gwenevere Ghazi  [facility] Health  Value-Based Care Institute, [facility] Guide  Direct Dial: [phone]  Fax [phone]

## 2023-10-26 ENCOUNTER — Ambulatory Visit: Payer: Self-pay | Admitting: Licensed Clinical Social Worker

## 2023-10-26 NOTE — Patient Outreach (Signed)
 Care Coordination   10/26/2023 Name: Rickey Fisher MRN: [id number] DOB: May 26, 1976   Care Coordination Outreach Attempts:  An unsuccessful telephone outreach was attempted today to offer the patient information about available complex care management services.  Follow Up Plan:  Additional outreach attempts will be made to offer the patient complex care management information and services.   Encounter Outcome:  No Answer   Care Coordination Interventions:  No, not indicated    Kenton Kingfisher, LCSW [place]/Value Based Care Institute, [facility] Licensed Clinical Social Worker Care Coordinator [phone]

## 2023-10-28 ENCOUNTER — Other Ambulatory Visit: Payer: Self-pay

## 2023-10-28 NOTE — Patient Instructions (Signed)
  Medicaid Managed Care   Unsuccessful Outreach Note  10/28/2023 Name: Rickey Fisher No MRN: [id number] DOB: April 22, 1976  Referred by: [name], MD Reason for referral : High Risk Managed Medicaid (MM social work unsuccessful telephone outreach )   An unsuccessful telephone outreach was attempted today. The patient was referred to the case management team for assistance with care management and care coordination.   Follow Up Plan: A HIPAA compliant phone message was left for the patient providing contact information and requesting a return call.   Abelino Derrick, MHA [facility] Health  Managed [employer] Social Worker [phone]

## 2023-10-28 NOTE — Patient Outreach (Signed)
  Medicaid Managed Care   Unsuccessful Outreach Note  10/28/2023 Name: Rickey Fisher MRN: [id number] DOB: April 22, 1976  Referred by: [name], MD Reason for referral : High Risk Managed Medicaid (MM social work unsuccessful telephone outreach )   An unsuccessful telephone outreach was attempted today. The patient was referred to the case management team for assistance with care management and care coordination.   Follow Up Plan: A HIPAA compliant phone message was left for the patient providing contact information and requesting a return call.   Abelino Derrick, MHA [facility] Health  Managed [employer] Social Worker [phone]

## 2023-11-04 ENCOUNTER — Telehealth: Payer: Self-pay | Admitting: *Deleted

## 2023-11-04 NOTE — Progress Notes (Signed)
 Complex Care Management Care Guide Note  11/04/2023 Name: Rickey Fisher MRN: [id number] DOB: [birth year]  Rickey Fisher is a 48 y.o. year old male who is a primary care patient of [name], MD and is actively engaged with the care management team. I reached out to [name] by phone today to assist with re-scheduling  with the Licensed Clinical Social Worker BSW.  Follow up plan: Unsuccessful telephone outreach attempt made. A HIPAA compliant phone message was left for the patient providing contact information and requesting a return call.  Gwenevere Ghazi  [facility]  Value-Based Care Institute, [facility] Guide  Direct Dial: [phone]  Fax [phone]

## 2023-11-15 NOTE — Progress Notes (Signed)
 Complex Care Management Care Guide Note  11/15/2023 Name: Rickey Fisher MRN: [id number] DOB: 11-17-1975  Rickey Fisher is a 48 y.o. year old male who is a primary care patient of [name], MD and is actively engaged with the care management team. I reached out to [name] by phone today to assist with re-scheduling  with the Licensed Clinical Social Worker BSW.  Follow up plan: Unsuccessful telephone outreach attempt made. A HIPAA compliant phone message was left for the patient providing contact information and requesting a return call. No further outreach attempts will be made at this time. We have been unable to contact the patient to reschedule for complex care management services.   Gwenevere Ghazi  [facility] Health  Value-Based Care Institute, [facility] Guide  Direct Dial: [phone]  Fax [phone]

## 2024-07-04 ENCOUNTER — Telehealth: Payer: Self-pay | Admitting: Internal Medicine

## 2024-07-04 DIAGNOSIS — Z932 Ileostomy status: Secondary | ICD-10-CM

## 2024-07-04 NOTE — Telephone Encounter (Signed)
 Please confirm that he still gets his ostomy supplies from [employer] then send this DME rxn.

## 2024-07-04 NOTE — Telephone Encounter (Signed)
 Please advise, if appointment is needed for ostomy supplies. Thank you.

## 2024-07-04 NOTE — Telephone Encounter (Signed)
 Copied from CRM #[id number]. Topic: Clinical - Prescription Issue >> Jul 04, 2024 10:54 AM Rickey Fisher wrote:  Reason for CRM: pt is needing his ostomy supplies and he advised he needed an appt first. I didn't have anything until Jan 21 and he needs them now. Can you please call the pt and see if you can get an appt earlier to accommodate this.    ----------------------------------------------------------------------- From previous Reason for Contact - Scheduling: Patient/patient representative is calling to schedule an appointment. Refer to attachments for appointment information.

## 2024-07-04 NOTE — Addendum Note (Signed)
 Addended by: VICCI SOBER B on: 07/04/2024 05:44 PM   Modules accepted: Orders

## 2024-07-09 NOTE — Telephone Encounter (Signed)
 Called & spoke to the patient. Verified name & DOB. Patient confirmed that he does use [employer] for his Ostomy supplies. Order successfully faxed to [employer] on 07/09/24.  Follow-up appointment scheduled for 08/22/23. Patient confirmed appointment.

## 2024-07-09 NOTE — Telephone Encounter (Signed)
 Noted! Thank you

## 2024-07-09 NOTE — Telephone Encounter (Signed)
 Patient's appointment has been moved to a sooner date 12/2 at 2:50 pm. Patient confirmed appointment.

## 2024-07-10 ENCOUNTER — Ambulatory Visit: Attending: Internal Medicine | Admitting: Internal Medicine

## 2024-07-10 ENCOUNTER — Encounter: Payer: Self-pay | Admitting: Internal Medicine

## 2024-07-10 VITALS — BP 121/83 | HR 60 | Temp 97.9°F | Ht 75.0 in | Wt 144.0 lb

## 2024-07-10 DIAGNOSIS — Z932 Ileostomy status: Secondary | ICD-10-CM

## 2024-07-10 DIAGNOSIS — Z681 Body mass index (BMI) 19 or less, adult: Secondary | ICD-10-CM

## 2024-07-10 DIAGNOSIS — R636 Underweight: Secondary | ICD-10-CM

## 2024-07-10 DIAGNOSIS — Z1159 Encounter for screening for other viral diseases: Secondary | ICD-10-CM

## 2024-07-10 DIAGNOSIS — R030 Elevated blood-pressure reading, without diagnosis of hypertension: Secondary | ICD-10-CM

## 2024-07-10 DIAGNOSIS — F17200 Nicotine dependence, unspecified, uncomplicated: Secondary | ICD-10-CM | POA: Diagnosis not present

## 2024-07-10 DIAGNOSIS — Z23 Encounter for immunization: Secondary | ICD-10-CM | POA: Diagnosis not present

## 2024-07-10 DIAGNOSIS — F331 Major depressive disorder, recurrent, moderate: Secondary | ICD-10-CM | POA: Diagnosis not present

## 2024-07-10 DIAGNOSIS — Z2821 Immunization not carried out because of patient refusal: Secondary | ICD-10-CM

## 2024-07-10 DIAGNOSIS — Z59869 Financial insecurity, unspecified: Secondary | ICD-10-CM

## 2024-07-10 NOTE — Patient Instructions (Signed)
  VISIT SUMMARY: You came in today for a follow-up visit to obtain documentation for your ostomy supplies. We discussed your current health status, including your ileostomy, mental health, and smoking habits. We also reviewed your general health maintenance and administered the pneumonia vaccine.  YOUR PLAN: -ILEOSTOMY STATUS: An ileostomy is a surgical opening created in the abdomen to allow waste to exit the body. We have sent a prescription and note to [employer] for your ostomy supplies and documented your ileostomy status and need for supplies.  -MAJOR DEPRESSIVE DISORDER, RECURRENT, MODERATE: This is a condition characterized by persistent feelings of sadness and loss of interest. You prefer to continue with your current coping strategies, such as listening to music, attending church, and drawing. We encourage you to keep up these activities and have offered counseling or medication if you change your mind.  -TOBACCO USE: Smoking can lead to serious health issues. You currently smoke 1-2 cigarettes per day, more when stressed. We encourage you to quit smoking to improve your overall health.  -GENERAL HEALTH MAINTENANCE: We administered the pneumonia vaccine today. You are also due for baseline blood tests, including kidney function, liver function, anemia screening, and hepatitis C screening.  INSTRUCTIONS: Please follow up with the baseline blood tests we ordered, which include kidney function, liver function, anemia screening, and hepatitis C screening. If you have any changes in your health or need further assistance, please contact our office.                      Contains text generated by [employer].                                 Contains text generated by [employer].

## 2024-07-10 NOTE — Progress Notes (Unsigned)
 Patient ID: Rickey Fisher, male    DOB: 26-Mar-1976  MRN: [id number]  CC: Follow-up (Follow-up. /Document ostomy supply need for [facility] /No to flu vax. Yes to pneumonia vax. )   Subjective: Rickey Fisher is a 48 y.o. male who presents for chronic ds management. His concerns today include:  Pt with congenital abn of intestine, ileostomy present, lumbar scoliosis, tob dep, MDD   Discussed the use of AI scribe software for clinical note transcription with the patient, who gave verbal consent to proceed.  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 1 each 1   silver  sulfADIAZINE  (SILVADENE ) 1 % cream Apply 1 Application topically daily. 50 g 0   No current facility-administered medications on file prior to visit.    Allergies  Allergen Reactions   Banana Anaphylaxis   Onion Anaphylaxis    Raw   Other Anaphylaxis    RASPBERRIES   Peach [Prunus Persica] Anaphylaxis   Shellfish Allergy Anaphylaxis   Tomato Anaphylaxis   Bee Pollen Itching   Pollen Extract Itching    Social History   Socioeconomic History   Marital status: Single    Spouse name: Not on file   Number of children: 0    Years of education: GED   Highest education level: Not on file  Occupational History    Employer: NOT EMPLOYED  Tobacco Use   Smoking status: Some Days    Current packs/day: 0.10    Types: Cigarettes   Smokeless tobacco: Never  Vaping Use   Vaping status: Never Used  Substance and Sexual Activity   Alcohol use: No   Drug use: Yes    Types: Marijuana   Sexual activity: Never  Other Topics Concern   Not on file  Social History Narrative   Worked at [employer]; Grew up in [place]; Went to [employer], dropped out in 11th grade,but acquired GED   [place]   Has 3 brothers, 1 sister - all in [place]   Mom & Dad in [place] as well.   Social Drivers of Health   Financial Resource Strain: High Risk (08/31/2023)   Overall Financial Resource Strain (CARDIA)    Difficulty of Paying Living Expenses: Very hard  Food Insecurity: Food Insecurity Present (08/25/2023)   Hunger Vital Sign    Worried About Running Out of Food in the Last Year: Sometimes true    Ran Out of Food in the Last Year: Sometimes true  Transportation Needs: No Transportation Needs (08/31/2023)   PRAPARE - Administrator, Civil Service (Medical): No    Lack of Transportation (Non-Medical): No  Physical Activity: Insufficiently Active (08/25/2023)   Exercise  Vital Sign    Days of Exercise per Week: 3 days    Minutes of Exercise per Session: 30 min  Stress: Stress Concern Present (09/16/2023)   [employer] of Occupational Health - Occupational Stress Questionnaire    Feeling of Stress : To some extent  Social Connections: Socially Isolated (08/25/2023)   Social Connection and Isolation Panel    Frequency of Communication with Friends and Family: Never    Frequency of Social Gatherings with Friends and Family: Never    Attends Religious Services: More than 4 times per year    Active Member of [employer] or Organizations: No    Attends Banker  Meetings: Never    Marital Status: Never married  Intimate Partner Violence: Not At Risk (08/25/2023)   Humiliation, Afraid, Rape, and Kick questionnaire    Fear of Current or Ex-Partner: No    Emotionally Abused: No    Physically Abused: No    Sexually Abused: No    Family History  Problem Relation Age of Onset   Hypertension Mother    Lupus Father    Colon cancer Neg Hx    Esophageal cancer Neg Hx    Rectal cancer Neg Hx    Stomach cancer Neg Hx     Past Surgical History:  Procedure Laterality Date   COLON SURGERY     COLOSTOMY     As a baby, does not know why\    ROS: Review of Systems Negative except as stated above  PHYSICAL EXAM: BP (!) 145/86 (BP Location: Left Arm, Patient Position: Sitting, Cuff Size: Normal)   Pulse 60   Temp 97.9 F (36.6 C) (Oral)   Ht 6' 3 (1.905 m)   Wt 144 lb (65.3 kg)   SpO2 100%   BMI 18.00 kg/m   Wt Readings from Last 3 Encounters:  07/10/24 144 lb (65.3 kg)  08/25/23 150 lb (68 kg)  05/23/23 139 lb (63 kg)    Physical Exam   {male adult master:[id number]}     08/31/2023   12:06 PM 08/25/2023   11:43 AM 02/22/2023   11:22 AM  Depression screen PHQ 2/9  Decreased Interest 1 1 2   Down, Depressed, Hopeless 2 3 2   PHQ - 2 Score 3 4 4   Altered sleeping 2 2 2   Tired, decreased energy 2 2 2   Change in appetite 0 0 1  Feeling bad or failure about yourself  1 2 2   Trouble concentrating 0 0 2  Moving slowly or fidgety/restless 2 2 0  Suicidal thoughts 0 1 0  PHQ-9 Score 10  13  13    Difficult doing work/chores  Somewhat difficult      Data saved with a previous flowsheet row definition       Latest Ref Rng & Units 02/22/2023   12:14 PM 09/25/2020    3:19 PM 06/28/2018   12:24 PM  CMP  Glucose 70 - 99 mg/dL 81  95  73   BUN 6 - 24 mg/dL 7  8  7    Creatinine 0.76 - 1.27 mg/dL 8.70  9.00  8.87   Sodium 134 - 144 mmol/L 139  145  139   Potassium 3.5 - 5.2 mmol/L 4.1  3.9  3.8   Chloride 96 - 106 mmol/L 103  103  103    CO2 20 - 29 mmol/L 24  21  21    Calcium 8.7 - 10.2 mg/dL 9.4  9.3  9.3   Total Protein 6.0 -  8.5 g/dL 7.1   7.0   Total Bilirubin 0.0 - 1.2 mg/dL 0.4   0.9   Alkaline Phos 44 - 121 IU/L 74   61   AST 0 - 40 IU/L 15   29   ALT 0 - 44 IU/L 10   29    Lipid Panel     Component Value Date/Time   CHOL 139 06/28/2018 1224   TRIG 61 06/28/2018 1224   HDL 78 06/28/2018 1224   CHOLHDL 1.8 06/28/2018 1224   LDLCALC 49 06/28/2018 1224    CBC    Component Value Date/Time   WBC 7.9 02/22/2023 1214   WBC 9.1 05/20/2016 1600   RBC 5.10 02/22/2023 1214   RBC 5.53 05/20/2016 1600   HGB 12.4 (L) 02/22/2023 1214   HCT 41.3 02/22/2023 1214   PLT 286 02/22/2023 1214   MCV 81 02/22/2023 1214   MCH 24.3 (L) 02/22/2023 1214   MCH 24.6 (L) 05/20/2016 1600   MCHC 30.0 (L) 02/22/2023 1214   MCHC 30.6 (L) 05/20/2016 1600   RDW 13.6 02/22/2023 1214   LYMPHSABS 1.7 09/25/2020 1519   MONOABS 0.7 07/03/2010 0210   EOSABS 0.3 09/25/2020 1519   BASOSABS 0.0 09/25/2020 1519    ASSESSMENT AND PLAN:  Assessment and Plan Assessment & Plan      1. Need for immunization against influenza (Primary) *** - Pneumococcal conjugate vaccine 20-valent    Patient was given the opportunity to ask questions.  Patient verbalized understanding of the plan and was able to repeat key elements of the plan.   This documentation was completed using Paediatric nurse.  Any transcriptional errors are unintentional.  Orders Placed This Encounter  Procedures   Pneumococcal conjugate vaccine 20-valent     Requested Prescriptions    No prescriptions requested or ordered in this encounter    No follow-ups on file.  Barnie Louder, MD, FACP

## 2024-07-11 ENCOUNTER — Ambulatory Visit: Payer: Self-pay | Admitting: Internal Medicine

## 2024-07-11 ENCOUNTER — Encounter: Payer: Self-pay | Admitting: Internal Medicine

## 2024-07-11 LAB — HEPATITIS C ANTIBODY: Hep C Virus Ab: NONREACTIVE

## 2024-07-11 LAB — COMPREHENSIVE METABOLIC PANEL WITH GFR
ALT: 13 IU/L (ref 0–44)
AST: 23 IU/L (ref 0–40)
Albumin: 4.3 g/dL (ref 4.1–5.1)
Alkaline Phosphatase: 66 IU/L (ref 47–123)
BUN/Creatinine Ratio: 6 — ABNORMAL LOW (ref 9–20)
BUN: 7 mg/dL (ref 6–24)
Bilirubin Total: 0.5 mg/dL (ref 0.0–1.2)
CO2: 22 mmol/L (ref 20–29)
Calcium: 9.2 mg/dL (ref 8.7–10.2)
Chloride: 104 mmol/L (ref 96–106)
Creatinine, Ser: 1.09 mg/dL (ref 0.76–1.27)
Globulin, Total: 2.3 g/dL (ref 1.5–4.5)
Glucose: 78 mg/dL (ref 70–99)
Potassium: 3.8 mmol/L (ref 3.5–5.2)
Sodium: 143 mmol/L (ref 134–144)
Total Protein: 6.6 g/dL (ref 6.0–8.5)
eGFR: 84 mL/min/1.73 (ref >59)

## 2024-07-11 LAB — CBC
Hematocrit: 43.7 % (ref 37.5–51.0)
Hemoglobin: 13.1 g/dL (ref 13.0–17.7)
MCH: 25.8 pg — ABNORMAL LOW (ref 26.6–33.0)
MCHC: 30 g/dL — ABNORMAL LOW (ref 31.5–35.7)
MCV: 86 fL (ref 79–97)
Platelets: 213 x10E3/uL (ref 150–450)
RBC: 5.07 x10E6/uL (ref 4.14–5.80)
RDW: 12.4 % (ref 11.6–15.4)
WBC: 8.2 x10E3/uL (ref 3.4–10.8)

## 2024-07-13 NOTE — Telephone Encounter (Signed)
 Copied from CRM [id number]. Topic: Clinical - Order For Equipment >> Jul 13, 2024 11:41 AM Willma SAUNDERS wrote:  Reason for CRM: Patient calling to check the status of the paperwork (visit notes) that Dr Vicci was supposed to fax to [facility] so the patient is able to get his ostomy supplies.  Patient can be reached at [phone]

## 2024-07-16 NOTE — Telephone Encounter (Signed)
 Late entry: Order and OV notes successfully faxed to [facility] on 07/11/2024. Fax number: [phone].  Called patient but no answer. Unable to LVM.

## 2025-01-08 ENCOUNTER — Ambulatory Visit: Admitting: Internal Medicine
